# Patient Record
Sex: Female | Born: 1975 | Race: White | Hispanic: No | Marital: Married | Smoking: Former smoker
Health system: Southern US, Community
[De-identification: ages and names within clinical notes are randomized; demographics above are authoritative.]

## PROBLEM LIST (undated history)

## (undated) DIAGNOSIS — S61452A Open bite of left hand, initial encounter: Secondary | ICD-10-CM

## (undated) DIAGNOSIS — L089 Local infection of the skin and subcutaneous tissue, unspecified: Secondary | ICD-10-CM

## (undated) HISTORY — DX: Local infection of the skin and subcutaneous tissue, unspecified: L08.9

## (undated) HISTORY — DX: Open bite of left hand, initial encounter: S61.452A

## (undated) HISTORY — PX: OTHER SURGICAL HISTORY: SHX169

---

## 1998-04-23 ENCOUNTER — Emergency Department (HOSPITAL_COMMUNITY): Admission: EM | Admit: 1998-04-23 | Discharge: 1998-04-23 | Payer: Self-pay | Admitting: Emergency Medicine

## 1998-09-14 ENCOUNTER — Inpatient Hospital Stay (HOSPITAL_COMMUNITY): Admission: AD | Admit: 1998-09-14 | Discharge: 1998-09-14 | Payer: Self-pay | Admitting: Obstetrics

## 1998-10-24 ENCOUNTER — Encounter: Payer: Self-pay | Admitting: Emergency Medicine

## 1998-10-24 ENCOUNTER — Emergency Department (HOSPITAL_COMMUNITY): Admission: EM | Admit: 1998-10-24 | Discharge: 1998-10-24 | Payer: Self-pay | Admitting: Emergency Medicine

## 1999-03-11 ENCOUNTER — Encounter: Payer: Self-pay | Admitting: Family Medicine

## 1999-03-11 ENCOUNTER — Ambulatory Visit (HOSPITAL_COMMUNITY): Admission: RE | Admit: 1999-03-11 | Discharge: 1999-03-11 | Payer: Self-pay | Admitting: Family Medicine

## 1999-04-06 ENCOUNTER — Inpatient Hospital Stay (HOSPITAL_COMMUNITY): Admission: EM | Admit: 1999-04-06 | Discharge: 1999-04-08 | Payer: Self-pay | Admitting: Emergency Medicine

## 2000-04-19 ENCOUNTER — Other Ambulatory Visit: Admission: RE | Admit: 2000-04-19 | Discharge: 2000-04-19 | Payer: Self-pay | Admitting: Obstetrics and Gynecology

## 2001-07-05 ENCOUNTER — Other Ambulatory Visit: Admission: RE | Admit: 2001-07-05 | Discharge: 2001-07-05 | Payer: Self-pay | Admitting: Obstetrics and Gynecology

## 2001-09-19 ENCOUNTER — Encounter: Payer: Self-pay | Admitting: Emergency Medicine

## 2001-09-19 ENCOUNTER — Emergency Department (HOSPITAL_COMMUNITY): Admission: EM | Admit: 2001-09-19 | Discharge: 2001-09-19 | Payer: Self-pay | Admitting: Emergency Medicine

## 2002-08-22 ENCOUNTER — Other Ambulatory Visit: Admission: RE | Admit: 2002-08-22 | Discharge: 2002-08-22 | Payer: Self-pay | Admitting: Obstetrics and Gynecology

## 2003-01-10 ENCOUNTER — Encounter: Payer: Self-pay | Admitting: Internal Medicine

## 2003-01-10 ENCOUNTER — Encounter: Admission: RE | Admit: 2003-01-10 | Discharge: 2003-01-10 | Payer: Self-pay | Admitting: Internal Medicine

## 2003-10-09 ENCOUNTER — Other Ambulatory Visit: Admission: RE | Admit: 2003-10-09 | Discharge: 2003-10-09 | Payer: Self-pay | Admitting: Obstetrics and Gynecology

## 2004-11-27 ENCOUNTER — Other Ambulatory Visit: Admission: RE | Admit: 2004-11-27 | Discharge: 2004-11-27 | Payer: Self-pay | Admitting: Obstetrics and Gynecology

## 2005-01-01 ENCOUNTER — Encounter: Admission: RE | Admit: 2005-01-01 | Discharge: 2005-01-01 | Payer: Self-pay | Admitting: Internal Medicine

## 2005-12-01 ENCOUNTER — Other Ambulatory Visit: Admission: RE | Admit: 2005-12-01 | Discharge: 2005-12-01 | Payer: Self-pay | Admitting: Obstetrics and Gynecology

## 2006-04-19 ENCOUNTER — Emergency Department (HOSPITAL_COMMUNITY): Admission: EM | Admit: 2006-04-19 | Discharge: 2006-04-19 | Payer: Self-pay | Admitting: Emergency Medicine

## 2008-11-29 ENCOUNTER — Ambulatory Visit: Payer: Self-pay | Admitting: Internal Medicine

## 2009-01-28 ENCOUNTER — Ambulatory Visit: Payer: Self-pay | Admitting: Internal Medicine

## 2009-02-18 ENCOUNTER — Ambulatory Visit: Payer: Self-pay | Admitting: Internal Medicine

## 2009-05-30 ENCOUNTER — Ambulatory Visit: Payer: Self-pay | Admitting: Sports Medicine

## 2009-05-30 DIAGNOSIS — M25569 Pain in unspecified knee: Secondary | ICD-10-CM | POA: Insufficient documentation

## 2009-05-30 DIAGNOSIS — M25559 Pain in unspecified hip: Secondary | ICD-10-CM | POA: Insufficient documentation

## 2009-05-30 DIAGNOSIS — M79609 Pain in unspecified limb: Secondary | ICD-10-CM | POA: Insufficient documentation

## 2010-11-11 ENCOUNTER — Ambulatory Visit: Payer: Self-pay | Admitting: Internal Medicine

## 2011-12-24 ENCOUNTER — Ambulatory Visit (INDEPENDENT_AMBULATORY_CARE_PROVIDER_SITE_OTHER): Payer: 59 | Admitting: Family Medicine

## 2011-12-24 ENCOUNTER — Encounter: Payer: Self-pay | Admitting: Family Medicine

## 2011-12-24 VITALS — BP 134/76 | HR 57 | Temp 97.8°F | Ht 64.0 in | Wt 146.0 lb

## 2011-12-24 DIAGNOSIS — M25579 Pain in unspecified ankle and joints of unspecified foot: Secondary | ICD-10-CM

## 2011-12-24 DIAGNOSIS — M25572 Pain in left ankle and joints of left foot: Secondary | ICD-10-CM

## 2011-12-24 NOTE — Patient Instructions (Signed)
Your musculoskeletal ultrasound was negative for a stress fracture. This is most consistent with posterior tibialis tendinopathy. Ice the area for 15 minutes at a time 3-4 times a day as needed (definitely do so after your runs). Strengthening exercises - theraband internal rotation and calf raises - 3 sets of 10 once a day. Buy inserts with good arch supports and try these with shorter runs at first - this will decrease how much you stretch this tendon out with pronation to toe push-off. Generally cut running down to about 50-70% of what you would normally do when dealing with pain. If necessary you may have to rest completely before race - it's better to go in undertrained and healthy than it is to push through training and limp through the race. As I stated this could be a stress reaction but this is less likely based on distribution of your pain, your description, exam, and normal ultrasound. Follow up with me as needed.

## 2011-12-25 ENCOUNTER — Encounter: Payer: Self-pay | Admitting: Family Medicine

## 2011-12-25 DIAGNOSIS — M25572 Pain in left ankle and joints of left foot: Secondary | ICD-10-CM | POA: Insufficient documentation

## 2011-12-25 NOTE — Assessment & Plan Note (Signed)
Left ankle post tib tendinopathy - less likely stress reaction (as she has normal msk ultrasound - would expect findings of stress fracture if this was present given she is over 3 weeks out from initiation of pain).  Pain also in distribution of post tib (to plantar foot, up lower leg), reproduced with these motions.  X-rays unlikely to be helpful.  We discussed possibility of going ahead with MRI as this is more sensitive/specific - if she continues to worsen instead of improve despite relative rest, icing, home exercises, would move forward with this.  Start home rehab exercises.  Advised to get additional arch support - she's previously had the sports insoles here and they made her shins hurt worse so declined these.  NSAIDs, icing.  See instructions for further.

## 2011-12-25 NOTE — Progress Notes (Signed)
Subjective:    Patient ID: Meghan Elliott, female    DOB: 1976-10-05, 36 y.o.   MRN: [id number]  PCP: [facility]  HPI 36 yo F here for left ankle pain.  Patient reports on new years eve she inverted her left ankle and developed worsening pain medial aspect of ankle. She believes she had some pain prior to this. Running over 40 miles a week - training for a marathon in a couple weeks and ultimately going to do a half ironman triathlon. No bruising - maybe some swelling over area. H/o prior sprains to this ankle. Able to run 21 miles a week and a half ago, 18 miles at a time this weekend - had pain after. Pain started yesterday about 2 miles into run medially shooting into bottom of foot and up medial left lower leg. Has been icing.  History reviewed. No pertinent past medical history.  No current outpatient prescriptions on file prior to visit.    History reviewed. No pertinent past surgical history.  Allergies  Allergen Reactions  . Penicillins     History   Social History  . Marital Status: Married    Spouse Name: N/A    Number of Children: N/A  . Years of Education: N/A   Occupational History  . Not on file.   Social History Main Topics  . Smoking status: Former Games developer  . Smokeless tobacco: Not on file  . Alcohol Use: Not on file  . Drug Use: Not on file  . Sexually Active: Not on file   Other Topics Concern  . Not on file   Social History Narrative  . No narrative on file    Family History  Problem Relation Age of Onset  . Diabetes Mother   . Hypertension Mother   . Hypertension Father   . Heart attack Neg Hx   . Hyperlipidemia Neg Hx   . Sudden death Neg Hx     BP 134/76  Pulse 57  Temp(Src) 97.8 F (36.6 C) (Oral)  Ht 5\' 4"  (1.626 m)  Wt 146 lb (66.225 kg)  BMI 25.06 kg/m2  Review of Systems See HPI above.    Objective:   Physical Exam Gen: NAD  L ankle: No gross deformity, swelling, ecchymoses FROM ankle with pain on resisted IR and  plantar flexion. TTP greatest within post tib tendon medially lower leg through ankle.  Less TTP medial malleolus. Negative ant drawer and talar tilt.   Thompsons test negative. NV intact distally. Mod overpronation. States she is unable to do hop test 2/2 pain.  MSK u/s: No evidence medial malleolar fracture or stress fracture - contour same as right medial malleolus without bony irregularities, edema overlying cortex, or neovascularity.  Post tib tendon appears intact without neovascularity, partial tears though mildly thickened.    Assessment & Plan:  1. Left ankle post tib tendinopathy - less likely stress reaction (as she has normal msk ultrasound - would expect findings of stress fracture if this was present given she is over 3 weeks out from initiation of pain).  Pain also in distribution of post tib (to plantar foot, up lower leg), reproduced with these motions.  X-rays unlikely to be helpful.  We discussed possibility of going ahead with MRI as this is more sensitive/specific - if she continues to worsen instead of improve despite relative rest, icing, home exercises, would move forward with this.  Start home rehab exercises.  Advised to get additional arch support - she's previously had the sports  insoles here and they made her shins hurt worse so declined these.  NSAIDs, icing.  See instructions for further.

## 2012-03-11 ENCOUNTER — Ambulatory Visit (HOSPITAL_BASED_OUTPATIENT_CLINIC_OR_DEPARTMENT_OTHER)
Admission: RE | Admit: 2012-03-11 | Discharge: 2012-03-11 | Disposition: A | Discharge status: Home / Self Care | Payer: 59 | Source: Ambulatory Visit | Attending: Family Medicine | Admitting: Family Medicine

## 2012-03-11 ENCOUNTER — Ambulatory Visit (INDEPENDENT_AMBULATORY_CARE_PROVIDER_SITE_OTHER): Payer: 59 | Admitting: Family Medicine

## 2012-03-11 ENCOUNTER — Encounter: Payer: Self-pay | Admitting: Family Medicine

## 2012-03-11 VITALS — BP 125/80 | HR 74 | Temp 98.1°F | Ht 64.0 in | Wt 148.0 lb

## 2012-03-11 DIAGNOSIS — M25579 Pain in unspecified ankle and joints of unspecified foot: Secondary | ICD-10-CM | POA: Insufficient documentation

## 2012-03-11 DIAGNOSIS — M25572 Pain in left ankle and joints of left foot: Secondary | ICD-10-CM

## 2012-03-11 DIAGNOSIS — M7730 Calcaneal spur, unspecified foot: Secondary | ICD-10-CM | POA: Insufficient documentation

## 2012-03-11 NOTE — Progress Notes (Addendum)
Subjective:    Patient ID: Meghan Elliott, female    DOB: Mar 28, 1976, 36 y.o.   MRN: [id number]  PCP: [facility]  HPI  36 yo F here for f/u left ankle pain.  1/24: Patient reports on new years eve she inverted her left ankle and developed worsening pain medial aspect of ankle. She believes she had some pain prior to this. Running over 40 miles a week - training for a marathon in a couple weeks and ultimately going to do a half ironman triathlon. No bruising - maybe some swelling over area. H/o prior sprains to this ankle. Able to run 21 miles a week and a half ago, 18 miles at a time this weekend - had pain after. Pain started yesterday about 2 miles into run medially shooting into bottom of foot and up medial left lower leg. Has been icing.  4/12: Despite home exercises, arch supports, icing, relative rest then increasing activities patient has continued to struggle with medial left ankle pain. Tried to run this morning - only made it 16 minutes and had to stop. No bruising, minimal swelling. Tried some changes to her gait as well that have not helped.  History reviewed. No pertinent past medical history.  Current Outpatient Prescriptions on File Prior to Visit  Medication Sig Dispense Refill  . GIANVI 3-0.02 MG tablet         History reviewed. No pertinent past surgical history.  Allergies  Allergen Reactions  . Penicillins     History   Social History  . Marital Status: Married    Spouse Name: N/A    Number of Children: N/A  . Years of Education: N/A   Occupational History  . Not on file.   Social History Main Topics  . Smoking status: Former Games developer  . Smokeless tobacco: Not on file  . Alcohol Use: Not on file  . Drug Use: Not on file  . Sexually Active: Not on file   Other Topics Concern  . Not on file   Social History Narrative  . No narrative on file    Family History  Problem Relation Age of Onset  . Diabetes Mother   . Hypertension Mother   .  Hypertension Father   . Heart attack Neg Hx   . Hyperlipidemia Neg Hx   . Sudden death Neg Hx     BP 125/80  Pulse 74  Temp(Src) 98.1 F (36.7 C) (Oral)  Ht 5\' 4"  (1.626 m)  Wt 148 lb (67.132 kg)  BMI 25.40 kg/m2  LMP 02/22/2012  Review of Systems  See HPI above.    Objective:   Physical Exam  Gen: NAD  L ankle: No gross deformity, swelling, ecchymoses FROM ankle without pain on resisted IR and plantar flexion - had pain here last visit. TTP greatest within post tib tendon medially lower leg through ankle and medial malleolus.  No lateral malleolus, fibular head, base 5th, navicular, or other TTP about ankle. Negative ant drawer and talar tilt.   Thompsons test negative. NV intact distally. Mod overpronation. Able to hop 5 times but pain medial ankle.     Assessment & Plan:  1. Left ankle pain - persistent, not improved with typical treatment for post tib tendinopathy and she has been compliant with this.  Radiographs today without bony abnormalities medially.  Most likely causes include recalcitrant post tib tendinopathy, partial post tib tendon tear, medial malleolar stress fracture.  Will move forward with MRI to further assess.  Continue arch  support, nsaids, icing, home exercises in meantime.  Addendum 4/16:  Discussed MRI results with patient - no evidence of stress fracture, tendon tears, other abnormalities.  Believe this is post tib tendinopathy.  Discussed options and she would like to try nitro patches so sent these in to her pharmacy to trial at least 6 weeks.  Discussed risks, benefits.  F/u in 6 weeks.

## 2012-03-11 NOTE — Assessment & Plan Note (Signed)
persistent, not improved with typical treatment for post tib tendinopathy and she has been compliant with this.  Radiographs today without bony abnormalities medially.  Most likely causes include recalcitrant post tib tendinopathy, partial post tib tendon tear, medial malleolar stress fracture.  Will move forward with MRI to further assess.  Continue arch support, nsaids, icing, home exercises in meantime.

## 2012-03-12 ENCOUNTER — Ambulatory Visit (HOSPITAL_BASED_OUTPATIENT_CLINIC_OR_DEPARTMENT_OTHER)
Admission: RE | Admit: 2012-03-12 | Discharge: 2012-03-12 | Disposition: A | Discharge status: Home / Self Care | Payer: 59 | Source: Ambulatory Visit | Attending: Family Medicine | Admitting: Family Medicine

## 2012-03-12 DIAGNOSIS — M25572 Pain in left ankle and joints of left foot: Secondary | ICD-10-CM

## 2012-03-12 DIAGNOSIS — M25579 Pain in unspecified ankle and joints of unspecified foot: Secondary | ICD-10-CM

## 2012-03-12 DIAGNOSIS — M7730 Calcaneal spur, unspecified foot: Secondary | ICD-10-CM | POA: Insufficient documentation

## 2012-03-15 MED ORDER — NITROGLYCERIN 0.2 MG/HR TD PT24: MEDICATED_PATCH | TRANSDERMAL | Status: DC

## 2012-03-15 NOTE — Progress Notes (Signed)
Addended by: Lenda Kelp on: 03/15/2012 09:40 AM   Modules accepted: Orders

## 2012-04-12 ENCOUNTER — Other Ambulatory Visit: Payer: Self-pay | Admitting: Chiropractic Medicine

## 2012-04-12 DIAGNOSIS — M25512 Pain in left shoulder: Secondary | ICD-10-CM

## 2012-04-13 ENCOUNTER — Ambulatory Visit
Admission: RE | Admit: 2012-04-13 | Discharge: 2012-04-13 | Disposition: A | Discharge status: Home / Self Care | Payer: 59 | Source: Ambulatory Visit | Attending: Chiropractic Medicine | Admitting: Chiropractic Medicine

## 2012-04-13 DIAGNOSIS — M25512 Pain in left shoulder: Secondary | ICD-10-CM

## 2012-04-14 ENCOUNTER — Ambulatory Visit (INDEPENDENT_AMBULATORY_CARE_PROVIDER_SITE_OTHER): Payer: 59 | Admitting: Family Medicine

## 2012-04-14 VITALS — BP 127/82

## 2012-04-14 DIAGNOSIS — M25512 Pain in left shoulder: Secondary | ICD-10-CM

## 2012-04-14 DIAGNOSIS — M25519 Pain in unspecified shoulder: Secondary | ICD-10-CM

## 2012-04-14 NOTE — Patient Instructions (Signed)
You have rotator cuff tendinopathy. Try to avoid painful activities (overhead activities, lifting with extended arm, reaching behind) as much as possible. Aleve and/or tylenol as needed for pain. Subacromial injection may be beneficial to help with pain and to decrease inflammation - you were given this today. I would recommend waiting until Sunday to do your race rehearsal and to swim. Continue working with Riki Rusk. Do home exercise program with theraband and scapular stabilization exercises daily - these are very important for long term relief even if an injection was given. If not improving at follow-up we will consider further imaging and/or physical therapy.

## 2012-04-15 ENCOUNTER — Encounter: Payer: Self-pay | Admitting: Family Medicine

## 2012-04-15 ENCOUNTER — Other Ambulatory Visit: Payer: 59

## 2012-04-15 DIAGNOSIS — M25512 Pain in left shoulder: Secondary | ICD-10-CM | POA: Insufficient documentation

## 2012-04-15 NOTE — Progress Notes (Signed)
Subjective:    Patient ID: Meghan Elliott, female    DOB: [birth year], 36 y.o.   MRN: [id number]  PCP: [facility]  Shoulder Pain    36 yo F here for new left shoulder pain.  1/24: Patient reports on new years eve she inverted her left ankle and developed worsening pain medial aspect of ankle. She believes she had some pain prior to this. Running over 40 miles a week - training for a marathon in a couple weeks and ultimately going to do a half ironman triathlon. No bruising - maybe some swelling over area. H/o prior sprains to this ankle. Able to run 21 miles a week and a half ago, 18 miles at a time this weekend - had pain after. Pain started yesterday about 2 miles into run medially shooting into bottom of foot and up medial left lower leg. Has been icing.  4/12: Despite home exercises, arch supports, icing, relative rest then increasing activities patient has continued to struggle with medial left ankle pain. Tried to run this morning - only made it 16 minutes and had to stop. No bruising, minimal swelling. Tried some changes to her gait as well that have not helped.  5/16: Patient's ankle is significantly improved with nitro patches. Returns today with 1 year of left shoulder pain - has been seeing chiropractor Thereasa Distance for treatment over this time. Overall has been tolerable - no other treatment since then. Is doing a half-ironman in 2 weeks. Pain worse in left shoulder with overhead motions, swimming. Feels pain in posterior shoulder as well. No radiation into arm. No numbness/tingling. No bowel/bladder dysfunction. Had MRI on 5/15 which showed moderate rotator cuff tendinopathy with possible undersurface tear/fraying.  No full thickness rotator cuff tears, labral pathology, other acute findings. Is right handed.   History reviewed. No pertinent past medical history.  Current Outpatient Prescriptions on File Prior to Visit  Medication Sig Dispense Refill  . GIANVI  3-0.02 MG tablet       . nitroGLYCERIN (NITRODUR - DOSED IN MG/24 HR) 0.2 mg/hr 1/4 patch over affected area of left ankle - change daily  30 patch  1    History reviewed. No pertinent past surgical history.  Allergies  Allergen Reactions  . Penicillins     History   Social History  . Marital Status: Married    Spouse Name: N/A    Number of Children: N/A  . Years of Education: N/A   Occupational History  . Not on file.   Social History Main Topics  . Smoking status: Former Games developer  . Smokeless tobacco: Not on file  . Alcohol Use: Not on file  . Drug Use: Not on file  . Sexually Active: Not on file   Other Topics Concern  . Not on file   Social History Narrative  . No narrative on file    Family History  Problem Relation Age of Onset  . Diabetes Mother   . Hypertension Mother   . Hypertension Father   . Heart attack Neg Hx   . Hyperlipidemia Neg Hx   . Sudden death Neg Hx     BP 127/82  Review of Systems  See HPI above.    Objective:   Physical Exam  Gen: NAD  L shoulder: No swelling, ecchymoses.  No gross deformity. TTP within left trapezius including overlying left scapula.  No biceps tendon, AC TTP. FROM with mild painful arc. Positive Hawkins, Neers. Negative Speeds, Yergasons. Strength 5/5 with empty can  and resisted internal/external rotation. Negative apprehension. NV intact distally.  R shoulder: FROM without pain or weakness.    Assessment & Plan:  1. Left shoulder pain - 2/2 rotator cuff tendinopathy with secondary scapular dysfunction/trapezius spasms.  Start home exercise program.  Icing, tylenol/aleve as needed.  Subacromial injection given today - rest for next few days from swimming, overhead activities, heavy lifting.  F/u prn.  Consider formal PT if not improving as expected.  After informed written consent, patient was seated on exam table. Left shoulder was prepped with alcohol swab and utilizing posterior approach, patient's  left subacromial space was injected with 3:1 marcaine: depomedrol. Patient tolerated the procedure well without immediate complications.

## 2012-04-15 NOTE — Assessment & Plan Note (Signed)
2/2 rotator cuff tendinopathy with secondary scapular dysfunction/trapezius spasms.  Start home exercise program.  Icing, tylenol/aleve as needed.  Subacromial injection given today - rest for next few days from swimming, overhead activities, heavy lifting.  F/u prn.  Consider formal PT if not improving as expected.  After informed written consent, patient was seated on exam table. Left shoulder was prepped with alcohol swab and utilizing posterior approach, patient's left subacromial space was injected with 3:1 marcaine: depomedrol. Patient tolerated the procedure well without immediate complications.

## 2013-11-04 ENCOUNTER — Encounter (HOSPITAL_COMMUNITY): Payer: Self-pay | Admitting: Emergency Medicine

## 2013-11-04 ENCOUNTER — Emergency Department (HOSPITAL_COMMUNITY)
Admission: EM | Admit: 2013-11-04 | Discharge: 2013-11-04 | Disposition: A | Discharge status: Home / Self Care | Payer: 59 | Attending: Emergency Medicine | Admitting: Emergency Medicine

## 2013-11-04 DIAGNOSIS — Z87891 Personal history of nicotine dependence: Secondary | ICD-10-CM | POA: Insufficient documentation

## 2013-11-04 DIAGNOSIS — S61409A Unspecified open wound of unspecified hand, initial encounter: Secondary | ICD-10-CM | POA: Insufficient documentation

## 2013-11-04 DIAGNOSIS — Z880 Allergy status to penicillin: Secondary | ICD-10-CM | POA: Insufficient documentation

## 2013-11-04 DIAGNOSIS — Y9389 Activity, other specified: Secondary | ICD-10-CM | POA: Insufficient documentation

## 2013-11-04 DIAGNOSIS — Z23 Encounter for immunization: Secondary | ICD-10-CM | POA: Insufficient documentation

## 2013-11-04 DIAGNOSIS — IMO0001 Reserved for inherently not codable concepts without codable children: Secondary | ICD-10-CM | POA: Insufficient documentation

## 2013-11-04 DIAGNOSIS — Y929 Unspecified place or not applicable: Secondary | ICD-10-CM | POA: Insufficient documentation

## 2013-11-04 DIAGNOSIS — S61452A Open bite of left hand, initial encounter: Secondary | ICD-10-CM

## 2013-11-04 MED ORDER — OXYCODONE-ACETAMINOPHEN 5-325 MG PO TABS: 1.0000 | ORAL_TABLET | ORAL | Status: DC | PRN

## 2013-11-04 MED ORDER — CLINDAMYCIN HCL 150 MG PO CAPS
300.0000 mg | ORAL_CAPSULE | Freq: Once | ORAL | Status: AC
  Administered 2013-11-04: 300 mg via ORAL
  Filled 2013-11-04: qty 2

## 2013-11-04 MED ORDER — BACITRACIN ZINC 500 UNIT/GM EX OINT
TOPICAL_OINTMENT | CUTANEOUS | Status: AC
  Administered 2013-11-04: 1 via TOPICAL
  Filled 2013-11-04: qty 0.9

## 2013-11-04 MED ORDER — TETANUS-DIPHTH-ACELL PERTUSSIS 5-2.5-18.5 LF-MCG/0.5 IM SUSP
0.5000 mL | Freq: Once | INTRAMUSCULAR | Status: AC
  Administered 2013-11-04: 0.5 mL via INTRAMUSCULAR
  Filled 2013-11-04: qty 0.5

## 2013-11-04 MED ORDER — CIPROFLOXACIN HCL 500 MG PO TABS: 500.0000 mg | ORAL_TABLET | Freq: Two times a day (BID) | ORAL | Status: DC

## 2013-11-04 MED ORDER — CIPROFLOXACIN HCL 250 MG PO TABS
500.0000 mg | ORAL_TABLET | Freq: Once | ORAL | Status: AC
  Administered 2013-11-04: 500 mg via ORAL
  Filled 2013-11-04: qty 2

## 2013-11-04 MED ORDER — CLINDAMYCIN HCL 300 MG PO CAPS: 300.0000 mg | ORAL_CAPSULE | Freq: Four times a day (QID) | ORAL | Status: DC

## 2013-11-04 MED ORDER — OXYCODONE-ACETAMINOPHEN 5-325 MG PO TABS
1.0000 | ORAL_TABLET | Freq: Once | ORAL | Status: AC
  Administered 2013-11-04: 1 via ORAL
  Filled 2013-11-04: qty 1

## 2013-11-04 NOTE — ED Provider Notes (Signed)
CSN: [id number]     Arrival date & time 11/04/13  0353 History   First MD Initiated Contact with Patient 11/04/13 0425     Chief Complaint  Patient presents with  . Animal Bite   (Consider location/radiation/quality/duration/timing/severity/associated sxs/prior Treatment) Patient is a 37 y.o. female presenting with animal bite. The history is provided by the patient.  Animal Bite She was bathing her cat when it bit her on the left hand. States that the teeth went in deeply. It initially was not painful but has gotten progressively more painful tonight is going on. This happened at about 6 PM. Currently, pain is rated 5/10 but if she moves her hand or touches the area, pain is 8/10. She denies fever or chills. She's not noticed any swelling or red streaks. The cat is up-to-date on all of its immunizations but the patient states her last tetanus immunization was about 13 years ago.   History reviewed. No pertinent past medical history. History reviewed. No pertinent past surgical history. Family History  Problem Relation Age of Onset  . Diabetes Mother   . Hypertension Mother   . Hypertension Father   . Heart attack Neg Hx   . Hyperlipidemia Neg Hx   . Sudden death Neg Hx    History  Substance Use Topics  . Smoking status: Former Games developer  . Smokeless tobacco: Not on file  . Alcohol Use: Not on file   OB History   Grav Para Term Preterm Abortions TAB SAB Ect Mult Living                 Review of Systems  All other systems reviewed and are negative.    Allergies  Penicillins  Home Medications   Current Outpatient Rx  Name  Route  Sig  Dispense  Refill  . GIANVI 3-0.02 MG tablet               . nitroGLYCERIN (NITRODUR - DOSED IN MG/24 HR) 0.2 mg/hr      1/4 patch over affected area of left ankle - change daily   30 patch   1    BP 118/65  Pulse 70  Temp(Src) 98.4 F (36.9 C) (Oral)  Resp 16  Ht 5\' 4"  (1.626 m)  Wt 183 lb (83.008 kg)  BMI 31.40 kg/m2  SpO2  98% Physical Exam  Nursing note and vitals reviewed.  37 year old female, resting comfortably and in no acute distress. Vital signs are normal. Oxygen saturation is 98%, which is normal. Head is normocephalic and atraumatic. PERRLA, EOMI. Oropharynx is clear. Neck is nontender and supple without adenopathy or JVD. Back is nontender and there is no CVA tenderness. Lungs are clear without rales, wheezes, or rhonchi. Chest is nontender. Heart has regular rate and rhythm without murmur. Abdomen is soft, flat, nontender without masses or hepatosplenomegaly and peristalsis is normoactive. Extremities have no cyanosis or edema, full range of motion is present. Two puncture wounds are seen in the left arm. One is at the base of the thenar eminence, and the other is on the radial aspect of the wrist. The entire wrist area is tender but there is no significant swelling or drainage. There is pain on passive range of motion of the wrist.  Skin is warm and dry without rash. Neurologic: Mental status is normal, cranial nerves are intact, there are no motor or sensory deficits.  ED Course  Procedures (including critical care time)  MDM   1. Cat bite of  hand, left, initial encounter     Plates of the left hand and wrist. TDaP booster is given and she'll need to be started on antibiotics. Case is discussed with Gramig who is on call for hand surgery. She is penicillin allergic and he has recommended that she be placed on clindamycin and ciprofloxacin for antibiotic coverage. He states that he can see her in followup tomorrow [facility] at [facility] if she is having significant pain, otherwise he will see her in his office in 2 days.    Dione Booze, MD 11/04/13 [phone]

## 2013-11-04 NOTE — ED Notes (Signed)
Pt's own domestic cat bit her left hand while she was trying to give it a bath. Pain now worse. 8/10

## 2013-12-21 ENCOUNTER — Encounter: Payer: Self-pay | Admitting: Gastroenterology

## 2014-01-11 ENCOUNTER — Ambulatory Visit (INDEPENDENT_AMBULATORY_CARE_PROVIDER_SITE_OTHER): Payer: 59 | Admitting: Gastroenterology

## 2014-01-11 ENCOUNTER — Encounter: Payer: Self-pay | Admitting: Gastroenterology

## 2014-01-11 VITALS — BP 112/70 | HR 64 | Ht 64.25 in | Wt 189.5 lb

## 2014-01-11 DIAGNOSIS — R197 Diarrhea, unspecified: Secondary | ICD-10-CM

## 2014-01-11 MED ORDER — METRONIDAZOLE 500 MG PO TABS: 500.0000 mg | ORAL_TABLET | Freq: Two times a day (BID) | ORAL | Status: DC

## 2014-01-11 NOTE — Patient Instructions (Signed)
We have sent the following medications to your pharmacy for you to pick up at your convenience: Flagyl.  Call back if symptoms fail to improve.   Thank you for choosing me and [facility].  Venita LickMalcolm T. Pleas KochStark, Jr., MD., Clementeen GrahamFACG  cc: Princella PellegriniJustin Reed, PA

## 2014-01-11 NOTE — Progress Notes (Signed)
    History of Present Illness: This is a 38 year old female who took a ten-day course of clindamycin and Cipro following a cat bite on December 9. She subsequently developed crampy abdominal pain and diarrhea. She was evaluated at an urgent care clinic and prescribed 3 days of metronidazole. Stool testing was unremarkable except for fecal leukocytes. I cannot locate a C. difficile test report. Her symptoms have almost completely resolved. Occasionally she notes loose stools. No longer has abdominal pain. Denies weight loss, constipation, change in stool caliber, melena, hematochezia, nausea, vomiting, dysphagia, reflux symptoms, chest pain.  Review of Systems: Pertinent positive and negative review of systems were noted in the above HPI section. All other review of systems were otherwise negative.  Current Medications, Allergies, Past Medical History, Past Surgical History, Family History and Social History were reviewed in [facility] electronic medical record.  Physical Exam: General: Well developed , well nourished, no acute distress Head: Normocephalic and atraumatic Eyes:  sclerae anicteric, EOMI Ears: Normal auditory acuity Mouth: No deformity or lesions Neck: Supple, no masses or thyromegaly Lungs: Clear throughout to auscultation Heart: Regular rate and rhythm; no murmurs, rubs or bruits Abdomen: Soft, non tender and non distended. No masses, hepatosplenomegaly or hernias noted. Normal Bowel sounds Rectal: Deferred Musculoskeletal: Symmetrical with no gross deformities  Skin: No lesions on visible extremities Pulses:  Normal pulses noted Extremities: No clubbing, cyanosis, edema or deformities noted Neurological: Alert oriented x 4, grossly nonfocal Cervical Nodes:  No significant cervical adenopathy Inguinal Nodes: No significant inguinal adenopathy Psychological:  Alert and cooperative. Normal mood and affect  Assessment and Recommendations:  1. Acute diarrhea with fecal  leukocytes following a course of antibiotics. Suspected antibiotic related diarrhea or C. difficile. Begin a 7 day course of Flagyl for possibility of partially treated C. difficile. If her symptoms have not completely resolved she is advised to contact us for further evaluation.

## 2014-04-03 ENCOUNTER — Encounter: Payer: Self-pay | Admitting: Internal Medicine

## 2014-04-03 ENCOUNTER — Ambulatory Visit (INDEPENDENT_AMBULATORY_CARE_PROVIDER_SITE_OTHER): Payer: 59 | Admitting: Internal Medicine

## 2014-04-03 VITALS — BP 134/92 | HR 80 | Temp 99.2°F | Wt 181.0 lb

## 2014-04-03 DIAGNOSIS — Z8719 Personal history of other diseases of the digestive system: Secondary | ICD-10-CM

## 2014-04-03 DIAGNOSIS — J019 Acute sinusitis, unspecified: Secondary | ICD-10-CM

## 2014-04-03 DIAGNOSIS — J029 Acute pharyngitis, unspecified: Secondary | ICD-10-CM

## 2014-04-03 DIAGNOSIS — IMO0001 Reserved for inherently not codable concepts without codable children: Secondary | ICD-10-CM

## 2014-04-03 MED ORDER — HYDROCODONE-HOMATROPINE 5-1.5 MG/5ML PO SYRP: 5.0000 mL | ORAL_SOLUTION | Freq: Three times a day (TID) | ORAL | Status: DC | PRN

## 2014-04-03 MED ORDER — CLARITHROMYCIN 500 MG PO TABS: 500.0000 mg | ORAL_TABLET | Freq: Two times a day (BID) | ORAL | Status: DC

## 2014-04-03 NOTE — Patient Instructions (Signed)
Take Biaxin 500 mg twice daily for 10 days. Take Hycodan as needed for cough. Call if not better in 2 weeks.

## 2014-04-03 NOTE — Progress Notes (Signed)
   Subjective:    Patient ID: Meghan JonesValarie Elliott, female    DOB: 1976-08-07, 38 y.o.   MRN: [id number]  HPI Patient not seen here since 2011. 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 She had developed hypertension and diabetes when she was overweight. She began to run marathons and lost a great deal waiting was able to come off medication. This past Saturday she exercised a good deal. She woke up Sunday morning with sore throat coughing congestion. Other coworkers have been ill. No fever or shaking chills. Says she feels horrible. She quit smoking several years ago.    Review of Systems     Objective:   Physical Exam TMs are clear. Pharynx is injected without exudate. Neck is supple without significant adenopathy. She sounds nasally congested when she speaks. Chest clear.        Assessment & Plan:  Acute sinusitis  Acute pharyngitis  Plan: Hycodan 8 ounces 1 teaspoon by mouth every 8 hours when necessary cough  Biaxin 500 mg twice daily for 10 days  Of note, she had a cat bite in December, [facility] with Cipro and clindamycin and developed C. difficile diarrhea treated by Dr. Russella DarStark.

## 2014-04-05 ENCOUNTER — Telehealth: Payer: Self-pay

## 2014-04-05 NOTE — Telephone Encounter (Signed)
Patient informed. 

## 2014-04-05 NOTE — Telephone Encounter (Signed)
I explained to her that this is a flu like illness. See again if she wants to be seen  or wait and she how she is after antibiotics are done. She is on a good antibiotic. Would not prescribe Tamiflu for this.

## 2014-04-05 NOTE — Telephone Encounter (Signed)
Seen here on Tuesday for URI. Feels worse today with headache, body aches and low grade fever. Thinks she has the flu now. Continues with antibiotics and cough syrup.

## 2015-05-21 ENCOUNTER — Other Ambulatory Visit (HOSPITAL_COMMUNITY): Payer: Self-pay | Admitting: Obstetrics and Gynecology

## 2015-05-21 DIAGNOSIS — Z1231 Encounter for screening mammogram for malignant neoplasm of breast: Secondary | ICD-10-CM

## 2015-06-06 ENCOUNTER — Ambulatory Visit (HOSPITAL_COMMUNITY): Payer: Self-pay

## 2015-06-10 ENCOUNTER — Ambulatory Visit (HOSPITAL_COMMUNITY)
Admission: RE | Admit: 2015-06-10 | Discharge: 2015-06-10 | Disposition: A | Discharge status: Home / Self Care | Payer: PRIVATE HEALTH INSURANCE | Source: Ambulatory Visit | Attending: Obstetrics and Gynecology | Admitting: Obstetrics and Gynecology

## 2015-06-10 DIAGNOSIS — Z1231 Encounter for screening mammogram for malignant neoplasm of breast: Secondary | ICD-10-CM | POA: Insufficient documentation

## 2015-12-20 ENCOUNTER — Encounter: Payer: Self-pay | Admitting: Internal Medicine

## 2015-12-20 ENCOUNTER — Ambulatory Visit (INDEPENDENT_AMBULATORY_CARE_PROVIDER_SITE_OTHER): Payer: PRIVATE HEALTH INSURANCE | Admitting: Internal Medicine

## 2015-12-20 ENCOUNTER — Telehealth: Payer: Self-pay

## 2015-12-20 VITALS — BP 118/80 | HR 118 | Temp 101.8°F | Resp 20 | Ht 64.0 in | Wt 188.0 lb

## 2015-12-20 DIAGNOSIS — R509 Fever, unspecified: Secondary | ICD-10-CM | POA: Diagnosis not present

## 2015-12-20 DIAGNOSIS — J029 Acute pharyngitis, unspecified: Secondary | ICD-10-CM

## 2015-12-20 LAB — POCT RAPID STREP A (OFFICE): RAPID STREP A SCREEN: NEGATIVE

## 2015-12-20 MED ORDER — HYDROCODONE-ACETAMINOPHEN 10-325 MG PO TABS: 1.0000 | ORAL_TABLET | Freq: Four times a day (QID) | ORAL | Status: DC | PRN

## 2015-12-20 MED ORDER — CLARITHROMYCIN 500 MG PO TABS: 500.0000 mg | ORAL_TABLET | Freq: Two times a day (BID) | ORAL | Status: DC

## 2015-12-20 NOTE — Telephone Encounter (Signed)
Patient c/o sore throat, HA. APPT scheduled for today at 2:45

## 2015-12-20 NOTE — Patient Instructions (Signed)
Biaxin 500 mg twice daily for 10 days. Norco 5/325 one by mouth every 8 hours when necessary headache

## 2015-12-20 NOTE — Progress Notes (Signed)
   Subjective:    Patient ID: Meghan Elliott, female    DOB: [birth year], 39 y.o.   MRN: [id number]  HPI Last weekend developed sore throat. Got progressively worse over the past couple of days. Has had fatigue, fever, headache. Sore throat has gotten worse.    Review of Systems     Objective:   Physical Exam  Pharynx is red. Rapid prescription screen negative. TMs are slightly full bilaterally. Neck is supple without adenopathy. Chest clear to auscultation without rales or wheezing. Skin warm and dry. She looks fatigued.      Assessment & Plan:  Acute pharyngitis  Plan: Biaxin 500 mg twice daily for 10 days. Norco 5/325 #30 tablets one by mouth every 8 hours when necessary headache. Rest and drink plenty of fluids.

## 2016-03-23 ENCOUNTER — Other Ambulatory Visit: Payer: Self-pay | Admitting: Internal Medicine

## 2016-03-23 ENCOUNTER — Other Ambulatory Visit: Payer: PRIVATE HEALTH INSURANCE | Admitting: Internal Medicine

## 2016-03-23 DIAGNOSIS — Z0000 Encounter for general adult medical examination without abnormal findings: Secondary | ICD-10-CM

## 2016-03-23 DIAGNOSIS — Z13220 Encounter for screening for lipoid disorders: Secondary | ICD-10-CM

## 2016-03-23 DIAGNOSIS — Z1321 Encounter for screening for nutritional disorder: Secondary | ICD-10-CM

## 2016-03-23 DIAGNOSIS — Z1329 Encounter for screening for other suspected endocrine disorder: Secondary | ICD-10-CM

## 2016-03-23 DIAGNOSIS — Z130 Encounter for screening for diseases of the blood and blood-forming organs and certain disorders involving the immune mechanism: Secondary | ICD-10-CM

## 2016-03-23 LAB — CBC WITH DIFFERENTIAL/PLATELET
BASOS PCT: 0 %
Basophils Absolute: 0 cells/uL (ref 0–200)
EOS PCT: 1 %
Eosinophils Absolute: 74 cells/uL (ref 15–500)
HCT: 40.6 % (ref 35.0–45.0)
Hemoglobin: 13.2 g/dL (ref 11.7–15.5)
LYMPHS PCT: 36 %
Lymphs Abs: 2664 cells/uL (ref 850–3900)
MCH: 30 pg (ref 27.0–33.0)
MCHC: 32.5 g/dL (ref 32.0–36.0)
MCV: 92.3 fL (ref 80.0–100.0)
MONOS PCT: 3 %
MPV: 9.5 fL (ref 7.5–12.5)
Monocytes Absolute: 222 cells/uL (ref 200–950)
NEUTROS ABS: 4440 {cells}/uL (ref 1500–7800)
Neutrophils Relative %: 60 %
PLATELETS: 354 10*3/uL (ref 140–400)
RBC: 4.4 MIL/uL (ref 3.80–5.10)
RDW: 13.3 % (ref 11.0–15.0)
WBC: 7.4 10*3/uL (ref 3.8–10.8)

## 2016-03-23 LAB — COMPLETE METABOLIC PANEL WITHOUT GFR
ALT: 14 U/L (ref 6–29)
AST: 13 U/L (ref 10–30)
Albumin: 3.8 g/dL (ref 3.6–5.1)
Alkaline Phosphatase: 51 U/L (ref 33–115)
BUN: 17 mg/dL (ref 7–25)
CO2: 23 mmol/L (ref 20–31)
Calcium: 8.4 mg/dL — ABNORMAL LOW (ref 8.6–10.2)
Chloride: 104 mmol/L (ref 98–110)
Creat: 0.87 mg/dL (ref 0.50–1.10)
GFR, Est African American: >89 mL/min
GFR, Est Non African American: 83 mL/min
Glucose, Bld: 94 mg/dL (ref 65–99)
Potassium: 5 mmol/L (ref 3.5–5.3)
Sodium: 136 mmol/L (ref 135–146)
Total Bilirubin: 0.4 mg/dL (ref 0.2–1.2)
Total Protein: 6.7 g/dL (ref 6.1–8.1)

## 2016-03-23 LAB — TSH: TSH: 2.11 m[IU]/L

## 2016-03-23 LAB — LIPID PANEL
Cholesterol: 172 mg/dL (ref 125–200)
HDL: 40 mg/dL — ABNORMAL LOW
LDL Cholesterol: 115 mg/dL
Total CHOL/HDL Ratio: 4.3 ratio
Triglycerides: 83 mg/dL
VLDL: 17 mg/dL

## 2016-03-24 ENCOUNTER — Ambulatory Visit (INDEPENDENT_AMBULATORY_CARE_PROVIDER_SITE_OTHER): Payer: PRIVATE HEALTH INSURANCE | Admitting: Internal Medicine

## 2016-03-24 ENCOUNTER — Encounter: Payer: Self-pay | Admitting: Internal Medicine

## 2016-03-24 VITALS — BP 128/78 | HR 74 | Temp 97.6°F | Resp 18 | Ht 64.0 in | Wt 193.0 lb

## 2016-03-24 DIAGNOSIS — E669 Obesity, unspecified: Secondary | ICD-10-CM | POA: Diagnosis not present

## 2016-03-24 DIAGNOSIS — Z0000 Encounter for general adult medical examination without abnormal findings: Secondary | ICD-10-CM

## 2016-03-24 DIAGNOSIS — R7302 Impaired glucose tolerance (oral): Secondary | ICD-10-CM | POA: Diagnosis not present

## 2016-03-24 DIAGNOSIS — R748 Abnormal levels of other serum enzymes: Secondary | ICD-10-CM

## 2016-03-24 LAB — HEMOGLOBIN A1C
Hgb A1c MFr Bld: 5.8 % — ABNORMAL HIGH (ref <5.7)
MEAN PLASMA GLUCOSE: 120 mg/dL

## 2016-03-24 LAB — VITAMIN D 25 HYDROXY (VIT D DEFICIENCY, FRACTURES): VIT D 25 HYDROXY: 38 ng/mL (ref 30–100)

## 2016-03-24 NOTE — Progress Notes (Signed)
Subjective:    Patient ID: Meghan Elliott, female    DOB: February 16, 1976, 40 y.o.   MRN: [id number]  HPI 40 year old White Female for health maintenance and evaluation of medical issues.   Patient first presented to this office in 2003. At that time she weighed 205 pounds. By 2005 she weighed 223 pounds. By 2007 she weighed 226 pounds and had developed elevated blood pressure. She was on oral contraceptives and was smoking. Subsequently got an IUD and began exercise and weight loss regimen. By June 2007 she weighed 199 pounds. Hemoglobin A1c was 6.3% in May 2007. By October 2007 it had decreased to 5.8%. By June 2008 she weighed 163 pounds. By March 2010 she weighed 146 pounds. Her hemoglobin A1c was normal at 5.3%.  No history of accidents or operations.  Allergic to penicillin  One pregnancy. Had mammogram at [facility]  Tetanus immunization done December 2014 at [facility]  Social history: She works as Print production planner at [employer]. Completed high school. She is married. She exercises for an hour daily 6 days a week. She quit smoking in 2010. One daughter age 14 in the military in [place] expecting a baby in the near future. Lives in [place].  In 2015 she was seen by Dr. Russella Dar regarding diarrhea after course of Cipro and clindamycin for cat bite. Was suspected to have antibiotic associated diarrhea or perhaps partially treated C. difficile. Was given course of Flagyl and diarrhea resolved.  Family Hx- deceased age 65 had stroke, DM with complications.  Father died accidentally struck in and shower. Oldest sister with IDDM. Youngest sister with ovarian cancer doing okay.  Review of Systems  Constitutional: Negative.   Respiratory: Negative.   Cardiovascular: Negative.   Genitourinary: Negative.        Objective:   Physical Exam  Constitutional: She is oriented to person, place, and time. She appears well-developed and well-nourished. No distress.  HENT:  Head:  Normocephalic and atraumatic.  Right Ear: External ear normal.  Left Ear: External ear normal.  Mouth/Throat: Oropharynx is clear and moist. No oropharyngeal exudate.  Eyes: Conjunctivae and EOM are normal. Pupils are equal, round, and reactive to light. Right eye exhibits no discharge. Left eye exhibits no discharge. No scleral icterus.  Neck: Neck supple. No JVD present. No thyromegaly present.  Cardiovascular: Normal rate, regular rhythm, normal heart sounds and intact distal pulses.   No murmur heard. Pulmonary/Chest: Effort normal and breath sounds normal. She has no wheezes. She has no rales.  Abdominal: Soft. Bowel sounds are normal. She exhibits no distension and no mass. There is no tenderness. There is no rebound and no guarding.  Genitourinary:  Deferred to Dr. Dareen Piano GYN  Musculoskeletal: She exhibits no edema.  Lymphadenopathy:    She has no cervical adenopathy.  Neurological: She is alert and oriented to person, place, and time. She has normal reflexes. No cranial nerve deficit. Coordination normal.  Skin: Skin is warm and dry. No rash noted. She is not diaphoretic.  Psychiatric: She has a normal mood and affect. Her behavior is normal. Judgment and thought content normal.  Vitals reviewed.         Assessment & Plan:  Obesity-her weight has increased from 146 pounds in 2011 to 193 pounds. Does drink one or 2 beers on weekends. Works out 1 hour a day 6 days a week. Although her serum glucose is normal. Her hemoglobin A1c is 5.8%. She should watch carbs. She is counting her  calories. May need to alter her exercise regimen.  Low HDL cholesterol-otherwise lipid panel is normal  Plan: Return in one year or as needed. Continue to get annual mammogram.

## 2016-03-25 DIAGNOSIS — R748 Abnormal levels of other serum enzymes: Secondary | ICD-10-CM | POA: Insufficient documentation

## 2016-03-25 DIAGNOSIS — E669 Obesity, unspecified: Secondary | ICD-10-CM | POA: Insufficient documentation

## 2016-03-25 DIAGNOSIS — R7302 Impaired glucose tolerance (oral): Secondary | ICD-10-CM | POA: Insufficient documentation

## 2016-03-25 NOTE — Patient Instructions (Addendum)
Continue diet exercise and weight loss regimen. Return in one year or as needed. Watch carbohydrate consumption.

## 2016-10-06 ENCOUNTER — Ambulatory Visit (INDEPENDENT_AMBULATORY_CARE_PROVIDER_SITE_OTHER): Payer: PRIVATE HEALTH INSURANCE | Admitting: Internal Medicine

## 2016-10-06 ENCOUNTER — Encounter: Payer: Self-pay | Admitting: Internal Medicine

## 2016-10-06 VITALS — BP 122/80 | HR 96 | Temp 98.8°F | Wt 185.0 lb

## 2016-10-06 DIAGNOSIS — J029 Acute pharyngitis, unspecified: Secondary | ICD-10-CM

## 2016-10-06 DIAGNOSIS — J069 Acute upper respiratory infection, unspecified: Secondary | ICD-10-CM | POA: Diagnosis not present

## 2016-10-06 MED ORDER — HYDROCODONE-ACETAMINOPHEN 5-325 MG PO TABS: ORAL_TABLET | ORAL | 0 refills | Status: DC

## 2016-10-06 MED ORDER — CLARITHROMYCIN 500 MG PO TABS: 500.0000 mg | ORAL_TABLET | Freq: Two times a day (BID) | ORAL | 0 refills | Status: DC

## 2016-10-06 NOTE — Progress Notes (Signed)
   Subjective:    Patient ID: Meghan Elliott, female    DOB: 04/27/1976, 40 y.o.   MRN: [id number]  HPI Onset couple of days ago sore throat and postnasal drip with drainage. No fever or chills. Some slight cough. Not much sputum production. No ear pain.    Review of Systems see above     Objective:   Physical Exam Pharynx is slightly injected without exudate. TMs are clear. Neck is supple. No adenopathy. Chest clear to auscultation without rales or wheezing       Assessment & Plan:   Acute URI  Acute pharyngitis  Plan: Biaxin 500 mg bid x 10 days. Norco 5/325 #20 one po q 8 hours prn pain

## 2016-10-06 NOTE — Patient Instructions (Signed)
Biaxin 500 mg twice daily for 10 days. Norco 5/325 one by mouth every 12 hours when necessary sore throat pain. Take both medications with a meal.

## 2017-06-04 ENCOUNTER — Other Ambulatory Visit (HOSPITAL_COMMUNITY): Payer: Self-pay | Admitting: Obstetrics and Gynecology

## 2017-06-04 DIAGNOSIS — Z1231 Encounter for screening mammogram for malignant neoplasm of breast: Secondary | ICD-10-CM

## 2017-06-10 ENCOUNTER — Ambulatory Visit (HOSPITAL_COMMUNITY)
Admission: RE | Admit: 2017-06-10 | Discharge: 2017-06-10 | Disposition: A | Discharge status: Home / Self Care | Payer: PRIVATE HEALTH INSURANCE | Source: Ambulatory Visit | Attending: Obstetrics and Gynecology | Admitting: Obstetrics and Gynecology

## 2017-06-10 DIAGNOSIS — Z1231 Encounter for screening mammogram for malignant neoplasm of breast: Secondary | ICD-10-CM | POA: Diagnosis not present

## 2017-09-07 ENCOUNTER — Other Ambulatory Visit: Payer: PRIVATE HEALTH INSURANCE | Admitting: Internal Medicine

## 2017-09-07 DIAGNOSIS — E669 Obesity, unspecified: Secondary | ICD-10-CM

## 2017-09-07 DIAGNOSIS — R7302 Impaired glucose tolerance (oral): Secondary | ICD-10-CM

## 2017-09-07 DIAGNOSIS — Z0000 Encounter for general adult medical examination without abnormal findings: Secondary | ICD-10-CM

## 2017-09-07 DIAGNOSIS — Z1329 Encounter for screening for other suspected endocrine disorder: Secondary | ICD-10-CM

## 2017-09-07 DIAGNOSIS — Z1321 Encounter for screening for nutritional disorder: Secondary | ICD-10-CM

## 2017-09-08 LAB — COMPLETE METABOLIC PANEL WITH GFR
AG Ratio: 1.1 (calc) (ref 1.0–2.5)
ALKALINE PHOSPHATASE (APISO): 58 U/L (ref 33–115)
ALT: 11 U/L (ref 6–29)
AST: 12 U/L (ref 10–30)
Albumin: 4 g/dL (ref 3.6–5.1)
BUN: 18 mg/dL (ref 7–25)
CO2: 27 mmol/L (ref 20–32)
Calcium: 9 mg/dL (ref 8.6–10.2)
Chloride: 100 mmol/L (ref 98–110)
Creat: 0.88 mg/dL (ref 0.50–1.10)
GFR, EST AFRICAN AMERICAN: 93 mL/min/{1.73_m2} (ref >=60)
GFR, Est Non African American: 80 mL/min/{1.73_m2} (ref >=60)
GLOBULIN: 3.6 g/dL (ref 1.9–3.7)
Glucose, Bld: 98 mg/dL (ref 65–99)
Potassium: 4.8 mmol/L (ref 3.5–5.3)
SODIUM: 136 mmol/L (ref 135–146)
Total Bilirubin: 0.4 mg/dL (ref 0.2–1.2)
Total Protein: 7.6 g/dL (ref 6.1–8.1)

## 2017-09-08 LAB — CBC WITH DIFFERENTIAL/PLATELET
BASOS ABS: 30 {cells}/uL (ref 0–200)
Basophils Relative: 0.4 %
EOS PCT: 1.2 %
Eosinophils Absolute: 90 cells/uL (ref 15–500)
HEMATOCRIT: 40.3 % (ref 35.0–45.0)
Hemoglobin: 13.7 g/dL (ref 11.7–15.5)
LYMPHS ABS: 2903 {cells}/uL (ref 850–3900)
MCH: 30.6 pg (ref 27.0–33.0)
MCHC: 34 g/dL (ref 32.0–36.0)
MCV: 90 fL (ref 80.0–100.0)
MPV: 9.5 fL (ref 7.5–12.5)
Monocytes Relative: 3.6 %
NEUTROS PCT: 56.1 %
Neutro Abs: 4208 cells/uL (ref 1500–7800)
Platelets: 351 10*3/uL (ref 140–400)
RBC: 4.48 10*6/uL (ref 3.80–5.10)
RDW: 12.3 % (ref 11.0–15.0)
Total Lymphocyte: 38.7 %
WBC: 7.5 10*3/uL (ref 3.8–10.8)
WBCMIX: 270 {cells}/uL (ref 200–950)

## 2017-09-08 LAB — TSH: TSH: 2.76 mIU/L

## 2017-09-08 LAB — LIPID PANEL
CHOLESTEROL: 235 mg/dL — AB (ref <200)
HDL: 50 mg/dL — ABNORMAL LOW (ref >50)
LDL CHOLESTEROL (CALC): 162 mg/dL — AB
Non-HDL Cholesterol (Calc): 185 mg/dL (calc) — ABNORMAL HIGH (ref <130)
Total CHOL/HDL Ratio: 4.7 (calc) (ref <5.0)
Triglycerides: 114 mg/dL (ref <150)

## 2017-09-08 LAB — HEMOGLOBIN A1C
HEMOGLOBIN A1C: 5.5 %{Hb} (ref <5.7)
MEAN PLASMA GLUCOSE: 111 (calc)
eAG (mmol/L): 6.2 (calc)

## 2017-09-08 LAB — VITAMIN D 25 HYDROXY (VIT D DEFICIENCY, FRACTURES): Vit D, 25-Hydroxy: 42 ng/mL (ref 30–100)

## 2017-09-08 LAB — MICROALBUMIN / CREATININE URINE RATIO
CREATININE, URINE: 60 mg/dL (ref 20–275)
MICROALB UR: 0.6 mg/dL
MICROALB/CREAT RATIO: 10 ug/mg{creat} (ref <30)

## 2017-09-09 ENCOUNTER — Encounter: Payer: Self-pay | Admitting: Internal Medicine

## 2017-09-09 ENCOUNTER — Ambulatory Visit (INDEPENDENT_AMBULATORY_CARE_PROVIDER_SITE_OTHER): Payer: PRIVATE HEALTH INSURANCE | Admitting: Internal Medicine

## 2017-09-09 VITALS — BP 116/82 | HR 97 | Temp 98.9°F | Ht 64.25 in | Wt 201.0 lb

## 2017-09-09 DIAGNOSIS — Z0000 Encounter for general adult medical examination without abnormal findings: Secondary | ICD-10-CM

## 2017-09-09 DIAGNOSIS — E7800 Pure hypercholesterolemia, unspecified: Secondary | ICD-10-CM

## 2017-09-09 DIAGNOSIS — R7302 Impaired glucose tolerance (oral): Secondary | ICD-10-CM

## 2017-09-09 LAB — POCT URINALYSIS DIPSTICK
BILIRUBIN UA: NEGATIVE
Glucose, UA: NEGATIVE
KETONES UA: NEGATIVE
Leukocytes, UA: NEGATIVE
Nitrite, UA: NEGATIVE
PH UA: 7 (ref 5.0–8.0)
Protein, UA: NEGATIVE
RBC UA: NEGATIVE
Spec Grav, UA: 1.02 (ref 1.010–1.025)
Urobilinogen, UA: 0.2 E.U./dL

## 2017-09-09 NOTE — Progress Notes (Signed)
   Subjective:    Patient ID: Meghan Elliott, female    DOB: 17-Dec-1975, 41 y.o.   MRN: [id number]  HPI 41 year old Female for health maintenance exam and evaluation of medical issues. Hx impaired glucose tolerance and hyperlipidemia. Gained 8 pounds since last CPE. Has had Pap and mammogram through GYN- Dr. Dareen Piano. Decliones flu vaccine.  She first presented to this office in 2003.  At that time she weighed 205 pounds.  I 2007 she weighed 226 pounds and had developed elevated blood pressure.  She was on oral contraceptives and was smoking.  She subsequently got an IUD and began to exercise and lose weight.  By June 2007 she weighed 199 pounds.  Hemoglobin A1c was 6.3% in May 2007.  By March 2010 she weighed 146 pounds and her hemoglobin A1c was 5.3%.  She began to train for triathlons.  No history of accidents or operations.  Allergic to Penicillin.  1 pregnancy.  Had tetanus immunization done at [facility] December 2014  Social history: She works as Print production planner at [employer].  Completed high school.  She is married.  Quit smoking in 2010.  One daughter in the military in [place] with a baby.  Lives in [place].  In 2015 she was seen by Dr. Russella Dar regarding diarrhea after course of Cipro and clindamycin for Bite.  It was suspected to have antibiotic associated diarrhea or perhaps partially treated C. difficile.  Was given course of Flagyl and diarrhea resolved.  Family history: Mother deceased at age 79 with stroke and diabetes complications.  Father died accidentally with injury. Oldest sister with insulin-dependent diabetes.  Younger sister with history of ovarian cancer doing okay.        Review of Systems  Constitutional: Negative.   All other systems reviewed and are negative.      Objective:   Physical Exam  Constitutional: She is oriented to person, place, and time. She appears well-developed and well-nourished. No distress.  HENT:  Head:  Normocephalic and atraumatic.  Right Ear: External ear normal.  Left Ear: External ear normal.  Mouth/Throat: Oropharynx is clear and moist.  Eyes: Pupils are equal, round, and reactive to light. Conjunctivae and EOM are normal. Right eye exhibits no discharge. Left eye exhibits no discharge.  Neck: Neck supple. No JVD present. No thyromegaly present.  Cardiovascular: Normal rate, normal heart sounds and intact distal pulses.   No murmur heard. Pulmonary/Chest:  Breasts normal female  Abdominal: Soft. Bowel sounds are normal. She exhibits no distension and no mass. There is no tenderness. There is no rebound and no guarding.  Genitourinary:  Genitourinary Comments: Deferred to GYN  Musculoskeletal: She exhibits no edema.  Neurological: She is alert and oriented to person, place, and time. She has normal reflexes. No cranial nerve deficit. Coordination normal.  Skin: Skin is warm and dry. No rash noted. She is not diaphoretic.  Psychiatric: She has a normal mood and affect. Her behavior is normal. Judgment and thought content normal.  Vitals reviewed.         Assessment & Plan:  Hyperlipidemia-total cholesterol has increased from 172 a year ago to 235.  LDL cholesterol has increased from 114-162.  Patient realizes she needs to start diet and exercise regimen once again.  Follow-up in 1 year or as needed.  Impaired glucose tolerance  Last year hemoglobin A1c was 5.8% and this year is 5.5%.  Continue to monitor

## 2017-09-26 NOTE — Patient Instructions (Signed)
It was a pleasure to see you today.  Cholesterol is elevated.  Please work on diet and exercise regimen.  Hemoglobin A1c is normal.  Return in 1 year or as needed.

## 2018-05-30 ENCOUNTER — Other Ambulatory Visit (HOSPITAL_COMMUNITY): Payer: Self-pay | Admitting: Obstetrics and Gynecology

## 2018-05-30 DIAGNOSIS — Z1231 Encounter for screening mammogram for malignant neoplasm of breast: Secondary | ICD-10-CM

## 2018-06-15 ENCOUNTER — Ambulatory Visit (HOSPITAL_COMMUNITY)
Admission: RE | Admit: 2018-06-15 | Discharge: 2018-06-15 | Disposition: A | Discharge status: Home / Self Care | Payer: PRIVATE HEALTH INSURANCE | Source: Ambulatory Visit | Attending: Obstetrics and Gynecology | Admitting: Obstetrics and Gynecology

## 2018-06-15 DIAGNOSIS — Z1231 Encounter for screening mammogram for malignant neoplasm of breast: Secondary | ICD-10-CM | POA: Insufficient documentation

## 2018-09-15 ENCOUNTER — Other Ambulatory Visit: Payer: Self-pay | Admitting: Internal Medicine

## 2018-09-15 DIAGNOSIS — Z0000 Encounter for general adult medical examination without abnormal findings: Secondary | ICD-10-CM

## 2018-09-15 DIAGNOSIS — Z1321 Encounter for screening for nutritional disorder: Secondary | ICD-10-CM

## 2018-09-15 DIAGNOSIS — Z1329 Encounter for screening for other suspected endocrine disorder: Secondary | ICD-10-CM

## 2018-09-15 DIAGNOSIS — E7800 Pure hypercholesterolemia, unspecified: Secondary | ICD-10-CM

## 2018-09-15 DIAGNOSIS — R7302 Impaired glucose tolerance (oral): Secondary | ICD-10-CM

## 2018-09-20 ENCOUNTER — Other Ambulatory Visit: Payer: PRIVATE HEALTH INSURANCE | Admitting: Internal Medicine

## 2018-09-20 DIAGNOSIS — Z1329 Encounter for screening for other suspected endocrine disorder: Secondary | ICD-10-CM

## 2018-09-20 DIAGNOSIS — Z0000 Encounter for general adult medical examination without abnormal findings: Secondary | ICD-10-CM

## 2018-09-20 DIAGNOSIS — Z1321 Encounter for screening for nutritional disorder: Secondary | ICD-10-CM

## 2018-09-20 DIAGNOSIS — E7800 Pure hypercholesterolemia, unspecified: Secondary | ICD-10-CM

## 2018-09-20 DIAGNOSIS — R7302 Impaired glucose tolerance (oral): Secondary | ICD-10-CM

## 2018-09-20 NOTE — Addendum Note (Signed)
Addended by: Tawnya Crook on: 09/20/2018 12:18 PM   Modules accepted: Orders

## 2018-09-20 NOTE — Addendum Note (Signed)
Addended by: Tawnya Crook on: 09/20/2018 12:15 PM   Modules accepted: Orders

## 2018-09-21 LAB — CBC WITH DIFFERENTIAL/PLATELET
BASOS PCT: 0.6 %
Basophils Absolute: 50 cells/uL (ref 0–200)
EOS ABS: 83 {cells}/uL (ref 15–500)
Eosinophils Relative: 1 %
HCT: 37.4 % (ref 35.0–45.0)
HEMOGLOBIN: 12.7 g/dL (ref 11.7–15.5)
Lymphs Abs: 3378 cells/uL (ref 850–3900)
MCH: 30.5 pg (ref 27.0–33.0)
MCHC: 34 g/dL (ref 32.0–36.0)
MCV: 89.9 fL (ref 80.0–100.0)
MONOS PCT: 3.3 %
MPV: 10.3 fL (ref 7.5–12.5)
Neutro Abs: 4515 cells/uL (ref 1500–7800)
Neutrophils Relative %: 54.4 %
Platelets: 299 10*3/uL (ref 140–400)
RBC: 4.16 10*6/uL (ref 3.80–5.10)
RDW: 12.2 % (ref 11.0–15.0)
Total Lymphocyte: 40.7 %
WBC: 8.3 10*3/uL (ref 3.8–10.8)
WBCMIX: 274 {cells}/uL (ref 200–950)

## 2018-09-21 LAB — HEMOGLOBIN A1C
Hgb A1c MFr Bld: 5.7 % of total Hgb — ABNORMAL HIGH (ref <5.7)
Mean Plasma Glucose: 117 (calc)
eAG (mmol/L): 6.5 (calc)

## 2018-09-21 LAB — VITAMIN D 25 HYDROXY (VIT D DEFICIENCY, FRACTURES): Vit D, 25-Hydroxy: 49 ng/mL (ref 30–100)

## 2018-09-21 LAB — MICROALBUMIN / CREATININE URINE RATIO
Creatinine, Urine: 134 mg/dL (ref 20–275)
MICROALB/CREAT RATIO: 3 ug/mg{creat} (ref <30)
Microalb, Ur: 0.4 mg/dL

## 2018-09-21 LAB — TSH: TSH: 2.49 m[IU]/L

## 2018-09-23 ENCOUNTER — Encounter: Payer: PRIVATE HEALTH INSURANCE | Admitting: Internal Medicine

## 2018-10-06 ENCOUNTER — Encounter: Payer: Self-pay | Admitting: Internal Medicine

## 2018-10-06 ENCOUNTER — Ambulatory Visit: Payer: PRIVATE HEALTH INSURANCE | Admitting: Internal Medicine

## 2018-10-06 VITALS — BP 120/80 | HR 68 | Temp 98.1°F | Ht 64.25 in | Wt 202.0 lb

## 2018-10-06 DIAGNOSIS — H6502 Acute serous otitis media, left ear: Secondary | ICD-10-CM | POA: Diagnosis not present

## 2018-10-06 DIAGNOSIS — H60312 Diffuse otitis externa, left ear: Secondary | ICD-10-CM

## 2018-10-06 MED ORDER — NEOMYCIN-POLYMYXIN-HC 3.5-10000-1 OT SOLN: OTIC | 0 refills | Status: DC

## 2018-10-06 MED ORDER — AZITHROMYCIN 250 MG PO TABS: ORAL_TABLET | ORAL | 0 refills | Status: DC

## 2018-10-06 NOTE — Progress Notes (Signed)
   Subjective:    Patient ID: Meghan Elliott, female    DOB: [birth year], 42 y.o.   MRN: [id number]  HPI Acute onset left otalgia. Awakened this am with it.  No fever or chills.  No significant sore throat.  Has had some postnasal drip.  Has washed her hair in the bathtub recently.  Hurts to touch her left earlobe she says.    Review of Systems see above     Objective:   Physical Exam Left external canal is slightly red but no drainage.  Left TM is full but not red.  Pharynx is very slightly injected.  Neck is supple.  Chest clear to auscultation.  No adenopathy.       Assessment & Plan:  Left external otitis  Acute left serous otitis media  Plan: Zithromax Z-PAK take 2 tabs day 1 followed by 1 tab days 2 through 5.  Cortisporin otic suspension 4 drops in left external ear canal 4 times a day for 5 days.  Has appointment for physical exam next week.  Can recheck then.

## 2018-10-06 NOTE — Patient Instructions (Signed)
Cortisporin otic suspension 4 drops in left external ear canal 4 times a day for 5 days.  Follow-up next week at time of physical exam.  Zithromax Z-PAK take 2 tabs day 1 followed by 1 tab days 2 through 5.

## 2018-10-08 LAB — GLUCOSE, POCT (MANUAL RESULT ENTRY): POC Glucose: 90 mg/dl (ref 70–99)

## 2018-10-12 ENCOUNTER — Encounter: Payer: Self-pay | Admitting: Internal Medicine

## 2018-10-12 ENCOUNTER — Ambulatory Visit (INDEPENDENT_AMBULATORY_CARE_PROVIDER_SITE_OTHER): Payer: PRIVATE HEALTH INSURANCE | Admitting: Internal Medicine

## 2018-10-12 VITALS — BP 102/80 | HR 85 | Ht 64.0 in | Wt 203.0 lb

## 2018-10-12 DIAGNOSIS — H6692 Otitis media, unspecified, left ear: Secondary | ICD-10-CM | POA: Diagnosis not present

## 2018-10-12 DIAGNOSIS — Z0000 Encounter for general adult medical examination without abnormal findings: Secondary | ICD-10-CM

## 2018-10-12 DIAGNOSIS — R7302 Impaired glucose tolerance (oral): Secondary | ICD-10-CM | POA: Diagnosis not present

## 2018-10-12 DIAGNOSIS — E7800 Pure hypercholesterolemia, unspecified: Secondary | ICD-10-CM | POA: Diagnosis not present

## 2018-10-12 LAB — POCT URINALYSIS DIPSTICK
APPEARANCE: NEGATIVE
Bilirubin, UA: NEGATIVE
Glucose, UA: NEGATIVE
Ketones, UA: NEGATIVE
LEUKOCYTES UA: NEGATIVE
NITRITE UA: NEGATIVE
Odor: NEGATIVE
PROTEIN UA: NEGATIVE
RBC UA: NEGATIVE
Spec Grav, UA: 1.01 (ref 1.010–1.025)
Urobilinogen, UA: 0.2 E.U./dL
pH, UA: 6.5 (ref 5.0–8.0)

## 2018-10-12 NOTE — Progress Notes (Signed)
Subjective:    Patient ID: Meghan Elliott, female    DOB: 1976/07/11, 42 y.o.   MRN: [id number]  HPI   42 year old Female for health maintenance exam and evaluation of medical issues.  Hgb AIC has increased to 5.7%. Still exercises and runs short races. Knows what to eat.  BMI is 34. Had lost 15 pounds this past Summer she says. By our records, has gained 2 pounds.  Had GYN exam by Dr. Dareen Piano. Had mammogram at [facility].  She first presented to this office in 2003.  At that time she weighed 205 pounds.  By 2007 she weighed 226 pounds and had developed elevated blood pressure.  She was smoking and was on oral contraceptives.  She subsequently had an IUD placed and began to exercise and lose weight.  By June 2007 she weighed 199 pounds.  Hemoglobin A1c was 6.3% in May 2007.  March 2010 she weighed 146 pounds and her hemoglobin A1c was 5.3%.  She began to train for triathlons.  No history of accidents or operations.  Allergic to penicillin.  One pregnancy.  She had tetanus immunization at any point in hospital December 2014.  In 2015 she was seen by Dr. Russella Dar regarding diarrhea after course of Cipro and clindamycin for cat bite prescribed in the emergency department.  It was suspected that she had antibiotic associated diarrhea  or perhaps partially treated C. difficile.  She was given a course of Flagyl and  diarrhea resolved.  Social history: She works as Print production planner at [employer].  Completed high school.  She is married.  Quit smoking in 2010.  One daughter in the military with a baby.  Patient resides in [place].  Family history: Mother died at age 74 with stroke and diabetes complications.  Father died accidentally with injury.  Older sister with insulin-dependent diabetes.  Younger sister with history of ovarian cancer doing okay.      Review of Systems  Constitutional: Negative.   HENT: Negative.        Left ear pain  Respiratory: Negative.     Cardiovascular: Negative.   Gastrointestinal: Negative.   Genitourinary: Negative.   Musculoskeletal: Negative.   Neurological: Negative.   Psychiatric/Behavioral: Negative.        Objective:   Physical Exam  Constitutional: She is oriented to person, place, and time. She appears well-developed and well-nourished. No distress.  HENT:  Head: Normocephalic and atraumatic.  Right Ear: External ear normal.  Left Ear: External ear normal.  Nose: Nose normal.  Mouth/Throat: Oropharynx is clear and moist. No oropharyngeal exudate.  Left external ear canal is red  Eyes: Pupils are equal, round, and reactive to light. Conjunctivae and EOM are normal. Right eye exhibits no discharge. Left eye exhibits no discharge. No scleral icterus.  Neck: Neck supple. No JVD present. No tracheal deviation present. No thyromegaly present.  Cardiovascular: Normal rate, regular rhythm and normal heart sounds.  No murmur heard. Pulmonary/Chest: Effort normal and breath sounds normal. No stridor. No respiratory distress. She has no wheezes. She has no rales.  Breasts normal female  Abdominal: Soft. She exhibits no distension and no mass. There is no tenderness. There is no rebound and no guarding.  Genitourinary:  Genitourinary Comments: Deferred to GYN  Musculoskeletal: She exhibits no edema.  Lymphadenopathy:    She has no cervical adenopathy.  Neurological: She is alert and oriented to person, place, and time. She displays normal reflexes. No cranial nerve deficit. She  exhibits normal muscle tone. Coordination normal.  Skin: Skin is warm and dry. No rash noted. She is not diaphoretic. No erythema. No pallor.  Psychiatric: She has a normal mood and affect. Her behavior is normal. Judgment and thought content normal.  Vitals reviewed.         Assessment & Plan:  Left external otitis- use Cortisporin otic suspension 4 drops in left ear 4 times a day for 5 days.  Patient does not know how she developed  this problem.  Has not been swimming.  Elevated hemoglobin A1c-is to work on diet exercise and weight loss and follow-up in 6 months  Pure hypercholesterolemia --a year ago total cholesterol was 235 and had been 172 when checked 2 years ago.Marland Kitchen.  LDL was 162 when checked a year ago.  Needs to get more serious about diet exercise and weight loss.  She is done it before and I am sure she can do it again.  For some reason her lipid panel was not done with this visit.  After the holidays it needs to be checked.

## 2018-10-23 DIAGNOSIS — E7800 Pure hypercholesterolemia, unspecified: Secondary | ICD-10-CM | POA: Insufficient documentation

## 2018-10-23 NOTE — Patient Instructions (Signed)
Use Cortisporin otic suspension in left external ear canal 4 times a day for 5 days.  Watch diet and try to exercise a bit more.  Lipid panel needs to be checked after the holidays.  Watch carbohydrates due to glucose intolerance.

## 2019-01-24 ENCOUNTER — Encounter: Payer: Self-pay | Admitting: Internal Medicine

## 2019-01-24 ENCOUNTER — Ambulatory Visit (INDEPENDENT_AMBULATORY_CARE_PROVIDER_SITE_OTHER): Payer: PRIVATE HEALTH INSURANCE | Admitting: Internal Medicine

## 2019-01-24 ENCOUNTER — Other Ambulatory Visit: Payer: Self-pay | Admitting: Internal Medicine

## 2019-01-24 VITALS — BP 130/80 | HR 100 | Temp 98.9°F | Ht 64.0 in | Wt 201.0 lb

## 2019-01-24 DIAGNOSIS — J029 Acute pharyngitis, unspecified: Secondary | ICD-10-CM | POA: Diagnosis not present

## 2019-01-24 DIAGNOSIS — R059 Cough, unspecified: Secondary | ICD-10-CM

## 2019-01-24 DIAGNOSIS — H6503 Acute serous otitis media, bilateral: Secondary | ICD-10-CM | POA: Diagnosis not present

## 2019-01-24 DIAGNOSIS — R05 Cough: Secondary | ICD-10-CM | POA: Diagnosis not present

## 2019-01-24 LAB — POCT RAPID STREP A (OFFICE): Rapid Strep A Screen: NEGATIVE

## 2019-01-24 MED ORDER — HYDROCODONE-HOMATROPINE 5-1.5 MG/5ML PO SYRP: 5.0000 mL | ORAL_SOLUTION | Freq: Three times a day (TID) | ORAL | 0 refills | Status: DC | PRN

## 2019-01-24 MED ORDER — AZITHROMYCIN 250 MG PO TABS
ORAL_TABLET | ORAL | 0 refills | Status: DC
Start: 2019-01-24 — End: 2019-10-19

## 2019-01-24 NOTE — Telephone Encounter (Signed)
Done

## 2019-01-24 NOTE — Addendum Note (Signed)
Addended by: Gregery Na on: 01/24/2019 12:04 PM   Modules accepted: Orders

## 2019-01-24 NOTE — Telephone Encounter (Signed)
Patient called and said that Dr Lenord Fellers said she was going to write her a script for a Z pack and cough syrup and when she got to the pharmacy they said they only have the Z pack

## 2019-01-24 NOTE — Telephone Encounter (Signed)
Please call her. This has now been sent

## 2019-01-25 NOTE — Progress Notes (Signed)
   Subjective:    Patient ID: Meghan Elliott, female    DOB: 1976/07/29, 43 y.o.   MRN: [id number]  HPI 43 year old Female in today with sore throat symptoms.  No fever or shaking chills.    Review of Systems  Constitutional: Positive for fatigue.  HENT: Positive for congestion. Negative for ear pain.   Eyes: Negative for discharge.  Respiratory: Negative.   Gastrointestinal: Negative.   Neurological: Negative for dizziness.       Objective:   Physical Exam Vitals signs reviewed.  Constitutional:      Appearance: She is well-developed.  HENT:     Head: Normocephalic and atraumatic.     Ears:     Comments: Both TMs slightly full right fuller than the left.    Nose: No rhinorrhea.     Mouth/Throat:     Mouth: Mucous membranes are moist.     Pharynx: No oropharyngeal exudate or uvula swelling.     Tonsils: No tonsillar exudate.  Eyes:     Conjunctiva/sclera: Conjunctivae normal.  Neck:     Musculoskeletal: Neck supple.  Pulmonary:     Effort: Pulmonary effort is normal. No respiratory distress.     Breath sounds: Normal breath sounds. No stridor. No wheezing.  Lymphadenopathy:     Cervical: No cervical adenopathy.  Skin:    General: Skin is warm and dry.  Neurological:     Mental Status: She is alert.           Assessment & Plan:  Rapid strep screen negative  Acute pharyngitis  Acute bilateral serous otitis media  Plan: Hycodan 1 teaspoon p.o. every 8 hours as needed cough.  Zithromax Z-PAK take 2 tablets day 1 followed by 1 tablet days 2 through 5.  Rest and drink plenty of fluids.

## 2019-01-25 NOTE — Patient Instructions (Signed)
Take Zithromax as directed.  2 p.o. day 1 followed by 1 p.o. days 2 through 5.  Hycodan sparingly as needed for cough 1 teaspoon p.o. every 8 hours as needed.  May help with sore throat pain as well.  Rest and drink plenty of fluids.  Rapid strep screen is negative.

## 2019-02-14 ENCOUNTER — Telehealth: Payer: Self-pay

## 2019-02-14 ENCOUNTER — Ambulatory Visit (INDEPENDENT_AMBULATORY_CARE_PROVIDER_SITE_OTHER): Payer: PRIVATE HEALTH INSURANCE | Admitting: Internal Medicine

## 2019-02-14 DIAGNOSIS — J101 Influenza due to other identified influenza virus with other respiratory manifestations: Secondary | ICD-10-CM

## 2019-02-14 DIAGNOSIS — R0602 Shortness of breath: Secondary | ICD-10-CM

## 2019-02-14 DIAGNOSIS — J22 Unspecified acute lower respiratory infection: Secondary | ICD-10-CM

## 2019-02-14 DIAGNOSIS — R05 Cough: Secondary | ICD-10-CM

## 2019-02-14 DIAGNOSIS — R059 Cough, unspecified: Secondary | ICD-10-CM

## 2019-02-14 DIAGNOSIS — R509 Fever, unspecified: Secondary | ICD-10-CM

## 2019-02-14 LAB — POCT INFLUENZA A/B
INFLUENZA B, POC: NEGATIVE
Influenza A, POC: POSITIVE — AB

## 2019-02-14 MED ORDER — HYDROCODONE-HOMATROPINE 5-1.5 MG/5ML PO SYRP: 5.0000 mL | ORAL_SOLUTION | Freq: Three times a day (TID) | ORAL | 0 refills | Status: DC | PRN

## 2019-02-14 MED ORDER — OSELTAMIVIR PHOSPHATE 75 MG PO CAPS: 75.0000 mg | ORAL_CAPSULE | Freq: Two times a day (BID) | ORAL | 0 refills | Status: DC

## 2019-02-14 MED ORDER — DOXYCYCLINE HYCLATE 100 MG PO TABS: 100.0000 mg | ORAL_TABLET | Freq: Two times a day (BID) | ORAL | 0 refills | Status: DC

## 2019-02-14 NOTE — Telephone Encounter (Signed)
Come for car testing

## 2019-02-14 NOTE — Telephone Encounter (Signed)
Patient called she got back from [place] yesterday and as soon she got out of the plane she had a low grade fever cough and congestion some sob when coughing, no CP, vomiting or diarrhea. Last night her temperature went up to 101. She said there were hundreds of cases of COVID-19 where she was, she wants to know if you want her go to somewhere to be tested for COVID-19?

## 2019-02-27 ENCOUNTER — Encounter: Payer: Self-pay | Admitting: Internal Medicine

## 2019-02-27 NOTE — Progress Notes (Signed)
   Subjective:    Patient ID: Meghan Elliott, female    DOB: 16-Mar-1976, 43 y.o.   MRN: [id number]  HPI Traveled to [place] this past weekend for a girls weekend.  Was in several airports and in close contact with people in the airport..  Has myalgias.  She was seen from her vehicle.    Review of Systems no nausea vomiting or diarrhea     Objective:   Physical Exam  Vital signs reviewed.  Rapid strep screen is negative.  Rapid flu test is positive.      Assessment & Plan:  Acute influenza A  Plan: Doxycycline 100 mg twice daily for 10 days for lower respiratory infection.  Hycodan 1 teaspoon p.o. every 8 hours as needed cough.  Tamiflu 75 mg twice daily for 5 days.  Rest and drink plenty of fluids.

## 2019-02-27 NOTE — Patient Instructions (Signed)
Tamiflu 75 mg twice daily for 5 days.  Hycodan 1 teaspoon p.o. every 8 hours as needed cough.  Doxycycline 100 mg twice daily for 10 days for lower respiratory infection.  Rest and drink plenty of fluids.

## 2019-05-11 ENCOUNTER — Other Ambulatory Visit (HOSPITAL_COMMUNITY): Payer: Self-pay | Admitting: Obstetrics and Gynecology

## 2019-05-11 DIAGNOSIS — Z1231 Encounter for screening mammogram for malignant neoplasm of breast: Secondary | ICD-10-CM

## 2019-06-19 ENCOUNTER — Ambulatory Visit (HOSPITAL_COMMUNITY)
Admission: RE | Admit: 2019-06-19 | Discharge: 2019-06-19 | Disposition: A | Discharge status: Home / Self Care | Payer: PRIVATE HEALTH INSURANCE | Source: Ambulatory Visit | Attending: Obstetrics and Gynecology | Admitting: Obstetrics and Gynecology

## 2019-06-19 ENCOUNTER — Encounter (HOSPITAL_COMMUNITY): Payer: Self-pay

## 2019-06-19 ENCOUNTER — Other Ambulatory Visit: Payer: Self-pay

## 2019-06-19 DIAGNOSIS — Z1231 Encounter for screening mammogram for malignant neoplasm of breast: Secondary | ICD-10-CM | POA: Insufficient documentation

## 2019-10-17 ENCOUNTER — Other Ambulatory Visit: Payer: Self-pay

## 2019-10-17 ENCOUNTER — Other Ambulatory Visit: Payer: PRIVATE HEALTH INSURANCE | Admitting: Internal Medicine

## 2019-10-17 DIAGNOSIS — Z1329 Encounter for screening for other suspected endocrine disorder: Secondary | ICD-10-CM

## 2019-10-17 DIAGNOSIS — R7302 Impaired glucose tolerance (oral): Secondary | ICD-10-CM

## 2019-10-17 DIAGNOSIS — Z1321 Encounter for screening for nutritional disorder: Secondary | ICD-10-CM

## 2019-10-17 DIAGNOSIS — E7800 Pure hypercholesterolemia, unspecified: Secondary | ICD-10-CM

## 2019-10-17 DIAGNOSIS — Z0000 Encounter for general adult medical examination without abnormal findings: Secondary | ICD-10-CM

## 2019-10-18 LAB — CBC WITH DIFFERENTIAL/PLATELET
Absolute Monocytes: 304 cells/uL (ref 200–950)
Basophils Absolute: 46 cells/uL (ref 0–200)
Basophils Relative: 0.5 %
Eosinophils Absolute: 74 cells/uL (ref 15–500)
Eosinophils Relative: 0.8 %
HCT: 37.8 % (ref 35.0–45.0)
Hemoglobin: 12.4 g/dL (ref 11.7–15.5)
Lymphs Abs: 3110 cells/uL (ref 850–3900)
MCH: 29.5 pg (ref 27.0–33.0)
MCHC: 32.8 g/dL (ref 32.0–36.0)
MCV: 90 fL (ref 80.0–100.0)
MPV: 9.8 fL (ref 7.5–12.5)
Monocytes Relative: 3.3 %
Neutro Abs: 5667 cells/uL (ref 1500–7800)
Neutrophils Relative %: 61.6 %
Platelets: 365 10*3/uL (ref 140–400)
RBC: 4.2 10*6/uL (ref 3.80–5.10)
RDW: 12.4 % (ref 11.0–15.0)
Total Lymphocyte: 33.8 %
WBC: 9.2 10*3/uL (ref 3.8–10.8)

## 2019-10-18 LAB — COMPLETE METABOLIC PANEL WITH GFR
AG Ratio: 1.4 (calc) (ref 1.0–2.5)
ALT: 12 U/L (ref 6–29)
AST: 13 U/L (ref 10–35)
Albumin: 3.8 g/dL (ref 3.6–5.1)
Alkaline phosphatase (APISO): 58 U/L (ref 31–125)
BUN: 18 mg/dL (ref 7–25)
CO2: 24 mmol/L (ref 20–32)
Calcium: 8.7 mg/dL (ref 8.6–10.2)
Chloride: 103 mmol/L (ref 98–110)
Creat: 0.8 mg/dL (ref 0.50–1.10)
GFR, Est African American: 103 mL/min/{1.73_m2} (ref >=60)
GFR, Est Non African American: 89 mL/min/{1.73_m2} (ref >=60)
Globulin: 2.8 g/dL (calc) (ref 1.9–3.7)
Glucose, Bld: 98 mg/dL (ref 65–99)
Potassium: 4.8 mmol/L (ref 3.5–5.3)
Sodium: 137 mmol/L (ref 135–146)
Total Bilirubin: 0.3 mg/dL (ref 0.2–1.2)
Total Protein: 6.6 g/dL (ref 6.1–8.1)

## 2019-10-18 LAB — HEMOGLOBIN A1C
Hgb A1c MFr Bld: 5.6 % of total Hgb (ref <5.7)
Mean Plasma Glucose: 114 (calc)
eAG (mmol/L): 6.3 (calc)

## 2019-10-18 LAB — LIPID PANEL
Cholesterol: 206 mg/dL — ABNORMAL HIGH (ref <200)
HDL: 45 mg/dL — ABNORMAL LOW (ref >=50)
LDL Cholesterol (Calc): 138 mg/dL (calc) — ABNORMAL HIGH
Non-HDL Cholesterol (Calc): 161 mg/dL (calc) — ABNORMAL HIGH (ref <130)
Total CHOL/HDL Ratio: 4.6 (calc) (ref <5.0)
Triglycerides: 115 mg/dL (ref <150)

## 2019-10-18 LAB — VITAMIN D 25 HYDROXY (VIT D DEFICIENCY, FRACTURES): Vit D, 25-Hydroxy: 27 ng/mL — ABNORMAL LOW (ref 30–100)

## 2019-10-18 LAB — TSH: TSH: 1.81 mIU/L

## 2019-10-19 ENCOUNTER — Encounter: Payer: Self-pay | Admitting: Internal Medicine

## 2019-10-19 ENCOUNTER — Ambulatory Visit (INDEPENDENT_AMBULATORY_CARE_PROVIDER_SITE_OTHER): Payer: PRIVATE HEALTH INSURANCE | Admitting: Internal Medicine

## 2019-10-19 ENCOUNTER — Other Ambulatory Visit: Payer: Self-pay

## 2019-10-19 VITALS — BP 120/80 | HR 102 | Ht 64.0 in | Wt 219.0 lb

## 2019-10-19 DIAGNOSIS — Z0000 Encounter for general adult medical examination without abnormal findings: Secondary | ICD-10-CM | POA: Diagnosis not present

## 2019-10-19 DIAGNOSIS — Z23 Encounter for immunization: Secondary | ICD-10-CM | POA: Diagnosis not present

## 2019-10-19 DIAGNOSIS — Z6837 Body mass index (BMI) 37.0-37.9, adult: Secondary | ICD-10-CM

## 2019-10-19 DIAGNOSIS — E7800 Pure hypercholesterolemia, unspecified: Secondary | ICD-10-CM

## 2019-10-19 NOTE — Progress Notes (Signed)
   Subjective:    Patient ID: Meghan Elliott, female    DOB: 1976-05-17, 43 y.o.   MRN: [id number]  HPI 43 year old Female for health maintenance exam and evaluation of medical issues. Hx of impaired glucose tolerance.  She first presented to this office in 2003.  At that time she weighed 205 pounds.  By 2007 she weighed 226 pounds and developed elevated blood pressure.  She was smoking and was on oral contraceptives.  Subsequently she had an IUD placed and began to exercise and lose weight.  June 2007 she weighed 199 pounds.  Hemoglobin A1c was 6.3% in May 2007.  In March 2010 she weighed 146 pounds and her hemoglobin A1c was 5.3%.  She began to train for triathlons.  No history of accidents or operations.  1 pregnancy.  Allergic to penicillin.  Had tetanus immunization at [facility] in  December 2014  In 2015 she was seen by Dr. Fuller Plan regarding diarrhea after course of Cipro and clindamycin for cat bite which was prescribed in the emergency department.  It was suspected she had antibiotic associated diarrhea or perhaps partially treated C. difficile.  She was given a course of Flagyl and diarrhea resolved.  Social history: She works as Glass blower/designer for [employer].  Completed high school.  She is married.  Quit smoking in 2010.  One daughter in the [place] with the baby.  Patient resides in [place].  Family history: Mother died at 56 with stroke and diabetes complications.  Father died accidentally with injury.  1 sister with insulin-dependent diabetes.  Younger sister with history of ovarian cancer doing okay.  Review of the labs shows vitamin D deficiency at 60.  Hemoglobin A1c 5.6%.  Improvement in total cholesterol from 235 2 years ago to 206.  Has low HDL of 45.  LDL has decreased from 1 62-1 38.  CBC and C met are normal.  She will be placed on high-dose vitamin D for 3 months then should take 4000 units vitamin D3 daily.  She does not want to be on statin  therapy.  Review of Systems jogging 4 miles twice a week and 7 or 8 miles once on weekends.     Objective:   Physical Exam Blood pressure 120/80.  BMI 37.59.  Weight 219 pounds.  Last year weighed 203 pounds.  In 2018 weight 201 pounds.  Skin warm and dry.  Nodes none.  TMs clear.  Neck is supple without JVD thyromegaly or carotid bruits.  Chest clear.  Breast without masses.  Cardiac exam regular rate and rhythm normal S1 and S2 without murmurs.  Abdomen no hepatosplenomegaly masses or tenderness.  No deformities of the extremities.  No edema.  Neuro no gross focal deficits on brief neurological exam.  GYN exam deferred to GYN physician       Assessment & Plan:  BMI 37.59-continue to work on diet and exercise.  Continue running for exercise.  Low HDL of 45  LDL elevated at 138 with a total cholesterol of 206.  Does not want to be on statin medication.  Continue to work on diet and exercise.  History of impaired glucose tolerance but hemoglobin A1c is normal.  Had flu in Spring 2020.  Flu vaccine given today.  Plan: Suggest follow-up in 6 to 12 months.  Tetanus immunization is up-to-date.  Flu vaccine given.

## 2019-10-20 ENCOUNTER — Other Ambulatory Visit: Payer: Self-pay

## 2019-10-20 LAB — POCT URINALYSIS DIPSTICK
Appearance: NEGATIVE
Bilirubin, UA: NEGATIVE
Blood, UA: NEGATIVE
Glucose, UA: NEGATIVE
Ketones, UA: NEGATIVE
Leukocytes, UA: NEGATIVE
Nitrite, UA: NEGATIVE
Odor: NEGATIVE
Protein, UA: NEGATIVE
Spec Grav, UA: 1.015 (ref 1.010–1.025)
Urobilinogen, UA: 0.2 E.U./dL
pH, UA: 6.5 (ref 5.0–8.0)

## 2019-10-20 MED ORDER — ERGOCALCIFEROL 1.25 MG (50000 UT) PO CAPS: 50000.0000 [IU] | ORAL_CAPSULE | ORAL | 1 refills | Status: DC

## 2019-10-28 NOTE — Patient Instructions (Signed)
Flu vaccine given.  It was a pleasure to see you today.  Continue to work on diet exercise and weight loss and return in 6 to 12 months.

## 2020-01-19 ENCOUNTER — Other Ambulatory Visit: Payer: Self-pay | Admitting: Internal Medicine

## 2020-02-01 ENCOUNTER — Ambulatory Visit: Payer: PRIVATE HEALTH INSURANCE | Attending: Internal Medicine

## 2020-02-01 DIAGNOSIS — Z23 Encounter for immunization: Secondary | ICD-10-CM | POA: Insufficient documentation

## 2020-02-01 NOTE — Progress Notes (Signed)
   Covid-19 Vaccination Clinic  Name:  Amelya Mabry    MRN: [id number] DOB: [birth year]  02/01/2020  Ms. Delapena was observed post Covid-19 immunization for 15 minutes without incident. She was provided with Vaccine Information Sheet and instruction to access the V-Safe system.   Ms. Moffit was instructed to call 911 with any severe reactions post vaccine: Marland Kitchen Difficulty breathing  . Swelling of face and throat  . A fast heartbeat  . A bad rash all over body  . Dizziness and weakness   Immunizations Administered    Name Date Dose VIS Date Route   Moderna COVID-19 Vaccine 02/01/2020 11:17 AM 0.5 mL 10/31/2019 Intramuscular   Manufacturer: Moderna   Lot: [id number]   NDC: 17471-595-39

## 2020-03-05 ENCOUNTER — Ambulatory Visit: Payer: Self-pay | Attending: Family

## 2020-03-05 DIAGNOSIS — Z23 Encounter for immunization: Secondary | ICD-10-CM

## 2020-03-05 NOTE — Progress Notes (Signed)
   Covid-19 Vaccination Clinic  Name:  [name]    MRN: [id number] DOB: 06/21/1976  03/05/2020  [name] was observed post Covid-19 immunization for 15 minutes without incident. She was provided with Vaccine Information Sheet and instruction to access the V-Safe system.   [name] was instructed to call 911 with any severe reactions post vaccine: Marland Kitchen Difficulty breathing  . Swelling of face and throat  . A fast heartbeat  . A bad rash all over body  . Dizziness and weakness   Immunizations Administered    Name Date Dose VIS Date Route   Moderna COVID-19 Vaccine 03/05/2020 11:36 AM 0.5 mL 10/31/2019 Intramuscular   Manufacturer: Moderna   Lot: [id number]   NDC: 03500-938-18

## 2020-06-07 ENCOUNTER — Other Ambulatory Visit (HOSPITAL_COMMUNITY): Payer: Self-pay | Admitting: Obstetrics and Gynecology

## 2020-06-07 DIAGNOSIS — Z1231 Encounter for screening mammogram for malignant neoplasm of breast: Secondary | ICD-10-CM

## 2020-06-21 ENCOUNTER — Ambulatory Visit (HOSPITAL_COMMUNITY)
Admission: RE | Admit: 2020-06-21 | Discharge: 2020-06-21 | Disposition: A | Discharge status: Home / Self Care | Payer: No Typology Code available for payment source | Source: Ambulatory Visit | Attending: Obstetrics and Gynecology | Admitting: Obstetrics and Gynecology

## 2020-06-21 ENCOUNTER — Other Ambulatory Visit: Payer: Self-pay

## 2020-06-21 DIAGNOSIS — Z1231 Encounter for screening mammogram for malignant neoplasm of breast: Secondary | ICD-10-CM | POA: Insufficient documentation

## 2020-11-07 ENCOUNTER — Other Ambulatory Visit: Payer: PRIVATE HEALTH INSURANCE | Admitting: Internal Medicine

## 2020-11-07 ENCOUNTER — Other Ambulatory Visit: Payer: Self-pay

## 2020-11-07 DIAGNOSIS — E559 Vitamin D deficiency, unspecified: Secondary | ICD-10-CM

## 2020-11-07 DIAGNOSIS — R7302 Impaired glucose tolerance (oral): Secondary | ICD-10-CM

## 2020-11-07 DIAGNOSIS — Z0000 Encounter for general adult medical examination without abnormal findings: Secondary | ICD-10-CM

## 2020-11-07 DIAGNOSIS — E7800 Pure hypercholesterolemia, unspecified: Secondary | ICD-10-CM

## 2020-11-08 LAB — CBC WITH DIFFERENTIAL/PLATELET
Absolute Monocytes: 249 cells/uL (ref 200–950)
Basophils Absolute: 50 cells/uL (ref 0–200)
Basophils Relative: 0.6 %
Eosinophils Absolute: 83 cells/uL (ref 15–500)
Eosinophils Relative: 1 %
HCT: 38.7 % (ref 35.0–45.0)
Hemoglobin: 12.9 g/dL (ref 11.7–15.5)
Lymphs Abs: 3179 cells/uL (ref 850–3900)
MCH: 30.4 pg (ref 27.0–33.0)
MCHC: 33.3 g/dL (ref 32.0–36.0)
MCV: 91.1 fL (ref 80.0–100.0)
MPV: 9.5 fL (ref 7.5–12.5)
Monocytes Relative: 3 %
Neutro Abs: 4739 cells/uL (ref 1500–7800)
Neutrophils Relative %: 57.1 %
Platelets: 386 10*3/uL (ref 140–400)
RBC: 4.25 10*6/uL (ref 3.80–5.10)
RDW: 12.1 % (ref 11.0–15.0)
Total Lymphocyte: 38.3 %
WBC: 8.3 10*3/uL (ref 3.8–10.8)

## 2020-11-08 LAB — COMPLETE METABOLIC PANEL WITH GFR
AG Ratio: 1.2 (calc) (ref 1.0–2.5)
ALT: 12 U/L (ref 6–29)
AST: 11 U/L (ref 10–35)
Albumin: 4 g/dL (ref 3.6–5.1)
Alkaline phosphatase (APISO): 54 U/L (ref 31–125)
BUN: 17 mg/dL (ref 7–25)
CO2: 25 mmol/L (ref 20–32)
Calcium: 8.9 mg/dL (ref 8.6–10.2)
Chloride: 102 mmol/L (ref 98–110)
Creat: 0.76 mg/dL (ref 0.50–1.10)
GFR, Est African American: 109 mL/min/{1.73_m2} (ref >=60)
GFR, Est Non African American: 94 mL/min/{1.73_m2} (ref >=60)
Globulin: 3.3 g/dL (calc) (ref 1.9–3.7)
Glucose, Bld: 100 mg/dL — ABNORMAL HIGH (ref 65–99)
Potassium: 4.6 mmol/L (ref 3.5–5.3)
Sodium: 137 mmol/L (ref 135–146)
Total Bilirubin: 0.5 mg/dL (ref 0.2–1.2)
Total Protein: 7.3 g/dL (ref 6.1–8.1)

## 2020-11-08 LAB — LIPID PANEL
Cholesterol: 209 mg/dL — ABNORMAL HIGH (ref <200)
HDL: 44 mg/dL — ABNORMAL LOW (ref >=50)
LDL Cholesterol (Calc): 144 mg/dL (calc) — ABNORMAL HIGH
Non-HDL Cholesterol (Calc): 165 mg/dL (calc) — ABNORMAL HIGH (ref <130)
Total CHOL/HDL Ratio: 4.8 (calc) (ref <5.0)
Triglycerides: 100 mg/dL (ref <150)

## 2020-11-08 LAB — TSH: TSH: 2.13 mIU/L

## 2020-11-08 LAB — HEMOGLOBIN A1C
Hgb A1c MFr Bld: 5.5 % of total Hgb (ref <5.7)
Mean Plasma Glucose: 111 mg/dL
eAG (mmol/L): 6.2 mmol/L

## 2020-11-11 ENCOUNTER — Telehealth: Payer: Self-pay | Admitting: Internal Medicine

## 2020-11-11 ENCOUNTER — Telehealth: Payer: Self-pay

## 2020-11-11 ENCOUNTER — Other Ambulatory Visit: Payer: Self-pay

## 2020-11-11 ENCOUNTER — Ambulatory Visit (INDEPENDENT_AMBULATORY_CARE_PROVIDER_SITE_OTHER): Payer: PRIVATE HEALTH INSURANCE | Admitting: Internal Medicine

## 2020-11-11 VITALS — HR 94 | Temp 99.4°F

## 2020-11-11 DIAGNOSIS — J0100 Acute maxillary sinusitis, unspecified: Secondary | ICD-10-CM

## 2020-11-11 MED ORDER — HYDROCODONE-HOMATROPINE 5-1.5 MG/5ML PO SYRP
5.0000 mL | ORAL_SOLUTION | Freq: Three times a day (TID) | ORAL | 0 refills | Status: DC | PRN
Start: 2020-11-11 — End: 2020-12-03

## 2020-11-11 MED ORDER — AZITHROMYCIN 250 MG PO TABS: ORAL_TABLET | ORAL | 0 refills | Status: DC

## 2020-11-11 NOTE — Telephone Encounter (Signed)
Meghan Elliott [phone]  [name] called to say she is stuffy and coughing, she also says she has nose and head congestion, this all started Saturday evening. She is at work, they said she could come in as long as she wore mask. She had her booster 3 1/2 weeks ago. She has been taking Nyquil since symptoms started.

## 2020-11-11 NOTE — Telephone Encounter (Signed)
Car visit 

## 2020-11-11 NOTE — Progress Notes (Signed)
   Subjective:    Patient ID: Meghan Elliott, female    DOB: [birth year], 44 y.o.   MRN: [id number]  HPI 44 year old Female seen acutely today for respiratory congestion and maxillary sinus discomfort. Some slight cough from nasal drainage. No fever or chills. Has had Covid-19 vaccines and had planned to get flu vaccine this week. Works at [employer] around others.   Obtained Covid-19 test and Respiratory virus panel today.  Review of Systems see above     Objective:   Physical Exam  Temperature 99.4 degrees pulse 94 regular pulse oximetry 99%. TMs are clear. Chest is clear to auscultation. No tachypnea. Pharynx is clear.     Assessment & Plan:  Acute maxillary sinusitis  Plan: COVID-19 and respiratory virus panels obtained. For some reason respiratory virus panel was not resulted by [facility]. However, COVID-19 test is negative. Zithromax Z-PAK 2 p.o. day 1 followed by 1 p.o. days 2 through 5.

## 2020-11-11 NOTE — Telephone Encounter (Signed)
Scheduled

## 2020-11-11 NOTE — Telephone Encounter (Signed)
Clarified with [facility] regarding questioning tests.

## 2020-11-12 ENCOUNTER — Encounter: Payer: PRIVATE HEALTH INSURANCE | Admitting: Internal Medicine

## 2020-11-12 MED ORDER — BENZONATATE 100 MG PO CAPS: ORAL_CAPSULE | ORAL | 0 refills | Status: DC

## 2020-11-13 ENCOUNTER — Telehealth: Payer: Self-pay | Admitting: Internal Medicine

## 2020-11-13 ENCOUNTER — Telehealth: Payer: Self-pay

## 2020-11-13 NOTE — Telephone Encounter (Signed)
Called [facility] and requested they process covid test stat.  They state they do not do this stat but have sent a request to have it processes as soon as possible.

## 2020-11-13 NOTE — Telephone Encounter (Signed)
Asking [facility] if Covid can be run STAT today

## 2020-11-16 LAB — SARS-COV-2 RNA (COVID-19) RESP VIRAL PNL QL NAAT

## 2020-11-16 LAB — TIQ-NTM

## 2020-12-02 ENCOUNTER — Encounter: Payer: PRIVATE HEALTH INSURANCE | Admitting: Internal Medicine

## 2020-12-03 ENCOUNTER — Ambulatory Visit (INDEPENDENT_AMBULATORY_CARE_PROVIDER_SITE_OTHER): Payer: PRIVATE HEALTH INSURANCE | Admitting: Internal Medicine

## 2020-12-03 ENCOUNTER — Other Ambulatory Visit: Payer: Self-pay

## 2020-12-03 ENCOUNTER — Encounter: Payer: Self-pay | Admitting: Internal Medicine

## 2020-12-03 VITALS — BP 110/80 | HR 97 | Ht 64.0 in | Wt 203.0 lb

## 2020-12-03 DIAGNOSIS — Z6834 Body mass index (BMI) 34.0-34.9, adult: Secondary | ICD-10-CM

## 2020-12-03 DIAGNOSIS — Z23 Encounter for immunization: Secondary | ICD-10-CM

## 2020-12-03 DIAGNOSIS — E7800 Pure hypercholesterolemia, unspecified: Secondary | ICD-10-CM

## 2020-12-03 DIAGNOSIS — Z0000 Encounter for general adult medical examination without abnormal findings: Secondary | ICD-10-CM

## 2020-12-03 DIAGNOSIS — R748 Abnormal levels of other serum enzymes: Secondary | ICD-10-CM | POA: Diagnosis not present

## 2020-12-03 LAB — POCT URINALYSIS DIPSTICK
Appearance: NEGATIVE
Bilirubin, UA: NEGATIVE
Blood, UA: NEGATIVE
Glucose, UA: NEGATIVE
Ketones, UA: NEGATIVE
Leukocytes, UA: NEGATIVE
Nitrite, UA: NEGATIVE
Odor: NEGATIVE
Protein, UA: NEGATIVE
Spec Grav, UA: 1.015 (ref 1.010–1.025)
Urobilinogen, UA: 0.2 E.U./dL
pH, UA: 6.5 (ref 5.0–8.0)

## 2020-12-03 NOTE — Patient Instructions (Signed)
Flu vaccine given. It was a pleasure to see today. Keep up the good work with weight loss. Continue diet and exercise regimen. RTC in one year or as needed.

## 2020-12-03 NOTE — Progress Notes (Signed)
   Subjective:    Patient ID: Meghan Elliott, female    DOB: 15-Dec-1975, 45 y.o.   MRN: [id number]  HPI  45 year old Female seen for health maintenance examination.  She has a history of impaired glucose tolerance.  She first presented to this office in 2003 and at that time weighed 205 pounds.  At 2007 she weighed 226 pounds and developed elevated blood pressure.  She was smoking and was on oral contraceptives.  Subsequently she had an IUD placed and began to exercise.  By June 2007 she weighed 199 pounds.  Hemoglobin A1c was 6.3% in May 2007.  March 2018 she weighed 146 pounds and her hemoglobin A1c was 5.3%.  She began to train for triathlons.  She continues to exercise.  Walks a bit and runs a bit and is getting back into shape.  No history of accidents or operations.  One pregnancy.  She is allergic to Penicillin.  History of vitamin D deficiency in 2020 and was placed on high-dose vitamin D for 3 months and then was advised to take over-the-counter vitamin D3.  History of elevated LDL cholesterol.  Does not want to be on statin therapy.  Social history: She works as Glass blower/designer for [employer].  Completed high school.  She is married.  Quit smoking in 2010.  One daughter.  [place]  Family history: Mother died at age 61 with stroke and diabetes complications.  Father died accidentally with injury.  One sister with insulin-dependent diabetes.  Younger sister with history of ovarian cancer doing okay.  Dipstick urine is normal.  Total cholesterol is 209, HDL cholesterol low at 44, LDL is elevated at 144.  Last year LDL was 138 and 3 years ago it was 162.  CBC is normal.  C-Met is 5.5%.  TSH is normal.  On November 11, 2020 she called complaining of head and nasal congestion with cough.  She has had all three Covid immunizations.  Respiratory virus panel was negative and COVID-19 test was also negative.    Review of Systems no new complaints      Objective:   Physical Exam Weight decreased 16 pounds past year.  Blood pressure 110/80 pulse 97 pulse oximetry 98% weight 203 pounds.  Height 5 feet 4 inches BMI 34.84.  Skin warm and dry.  Nodes none.  TMs are clear.  Neck is supple without thyromegaly or carotid bruits.  Chest clear to auscultation.  Cardiac exam regular rate and rhythm normal S1 and S2 without murmurs.  Breast without masses.  Abdomen soft nondistended without hepatosplenomegaly masses or tenderness.  Lower extremities without edema or deformity.  Neuro intact without focal deficits.  Affect thought and judgment are normal.       Assessment & Plan:  BMI 34 and previously was 37.59 in November 2020.  Continue running for exercise and continue working on diet.  History of low HDL-currently it is 44 and was as high as 50 when checked 3 years ago.  History of impaired glucose tolerance.  Highest A1c was 4 years ago at 5.8%.  It is now normal at 5.5%.  She continues to watch her diet and is not on glucose lowering medication.  Health maintenance-flu vaccine given today.  Has had two maternal COVID-19 immunizations in March and April 2021.

## 2020-12-22 ENCOUNTER — Encounter: Payer: Self-pay | Admitting: Internal Medicine

## 2020-12-22 NOTE — Patient Instructions (Signed)
COVID-19 PCR test obtained and is pending. Take Zithromax Z-PAK 2 p.o. day 1 followed by 1 p.o. days 2 through 5.

## 2021-05-29 ENCOUNTER — Other Ambulatory Visit (HOSPITAL_COMMUNITY): Payer: Self-pay | Admitting: Obstetrics

## 2021-05-29 DIAGNOSIS — Z1231 Encounter for screening mammogram for malignant neoplasm of breast: Secondary | ICD-10-CM

## 2021-06-06 ENCOUNTER — Ambulatory Visit: Payer: No Typology Code available for payment source | Admitting: Internal Medicine

## 2021-06-06 ENCOUNTER — Telehealth: Payer: Self-pay | Admitting: Internal Medicine

## 2021-06-06 ENCOUNTER — Other Ambulatory Visit: Payer: Self-pay

## 2021-06-06 ENCOUNTER — Encounter: Payer: Self-pay | Admitting: Internal Medicine

## 2021-06-06 VITALS — BP 120/80 | HR 88 | Temp 98.5°F | Wt 216.0 lb

## 2021-06-06 DIAGNOSIS — L237 Allergic contact dermatitis due to plants, except food: Secondary | ICD-10-CM

## 2021-06-06 MED ORDER — PREDNISONE 10 MG PO TABS: ORAL_TABLET | ORAL | 1 refills | Status: DC

## 2021-06-06 NOTE — Telephone Encounter (Addendum)
Meghan Elliott [phone]  [name] called to say she had gotten into some poison oak and has it on her face and left eye and on her right arm. Scheduled her for office visit. Has had COVID vaccines, no exposure that she knows of. Leaving to go out of time this weekend.

## 2021-06-06 NOTE — Progress Notes (Signed)
   Subjective:    Patient ID: Meghan Elliott, female    DOB: [birth year], 45 y.o.   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 Has had symptoms for several days and feels it is worse. Has several lesion left periorbital area but eye is not swollen shut and there is no eye discharge. Also has lesions nose, arms and chin.  Had CPE in January 2022.History of impaired glucose tolerance that reversed with diet, exercise and weight loss. Hgb AIC December 2020 normal at 5.5%  No hx of accidents or operations. One pregnancy. Allergic to Penicillin. Hx of Vitamin D deficiency. Level was 27 October 2019.   Review of Systems see above     Objective:   Physical Exam  BP 120/80, pulse 88 ,Temp 98.5 degrees, pulse ox 97% ,weight 216 pounds  Has erythematous linear lesions x 3 left temple. Similar lesions nose chins and arms. These are not draining or weeping.      Assessment & Plan:   Poison ivy involving face and arms  Plan: Prednisone 10 mg (#21) to take in tapering course starting with 6 tabs day 1 and decrease by 1 tab daily 6-5-4-3-2-1 taper with 1 refill.

## 2021-06-06 NOTE — Patient Instructions (Signed)
Take prednisone in tapering course as directed 6-5-4-3-2-1. You have one refill on this prescription. It was a pleasure to see you today.

## 2021-06-23 ENCOUNTER — Ambulatory Visit (HOSPITAL_COMMUNITY)
Admission: RE | Admit: 2021-06-23 | Discharge: 2021-06-23 | Disposition: A | Discharge status: Home / Self Care | Payer: No Typology Code available for payment source | Source: Ambulatory Visit | Attending: Obstetrics | Admitting: Obstetrics

## 2021-06-23 ENCOUNTER — Other Ambulatory Visit: Payer: Self-pay

## 2021-06-23 DIAGNOSIS — Z1231 Encounter for screening mammogram for malignant neoplasm of breast: Secondary | ICD-10-CM | POA: Insufficient documentation

## 2021-06-24 ENCOUNTER — Other Ambulatory Visit (HOSPITAL_COMMUNITY): Payer: Self-pay | Admitting: Obstetrics

## 2021-06-25 ENCOUNTER — Other Ambulatory Visit (HOSPITAL_COMMUNITY): Payer: Self-pay | Admitting: Obstetrics

## 2021-06-25 DIAGNOSIS — R928 Other abnormal and inconclusive findings on diagnostic imaging of breast: Secondary | ICD-10-CM

## 2021-06-27 ENCOUNTER — Other Ambulatory Visit: Payer: Self-pay

## 2021-06-27 ENCOUNTER — Encounter (HOSPITAL_COMMUNITY): Payer: Self-pay

## 2021-06-27 ENCOUNTER — Ambulatory Visit (HOSPITAL_COMMUNITY)
Admission: RE | Admit: 2021-06-27 | Discharge: 2021-06-27 | Disposition: A | Discharge status: Home / Self Care | Payer: No Typology Code available for payment source | Source: Ambulatory Visit | Attending: Obstetrics | Admitting: Obstetrics

## 2021-06-27 DIAGNOSIS — R928 Other abnormal and inconclusive findings on diagnostic imaging of breast: Secondary | ICD-10-CM

## 2022-01-13 ENCOUNTER — Other Ambulatory Visit: Payer: Self-pay

## 2022-01-13 ENCOUNTER — Other Ambulatory Visit: Payer: PRIVATE HEALTH INSURANCE | Admitting: Internal Medicine

## 2022-01-13 DIAGNOSIS — E7800 Pure hypercholesterolemia, unspecified: Secondary | ICD-10-CM

## 2022-01-13 DIAGNOSIS — Z1329 Encounter for screening for other suspected endocrine disorder: Secondary | ICD-10-CM

## 2022-01-13 DIAGNOSIS — Z0000 Encounter for general adult medical examination without abnormal findings: Secondary | ICD-10-CM

## 2022-01-14 LAB — COMPLETE METABOLIC PANEL WITH GFR
AG Ratio: 1.4 (calc) (ref 1.0–2.5)
ALT: 28 U/L (ref 6–29)
AST: 21 U/L (ref 10–35)
Albumin: 4.3 g/dL (ref 3.6–5.1)
Alkaline phosphatase (APISO): 65 U/L (ref 31–125)
BUN: 19 mg/dL (ref 7–25)
CO2: 27 mmol/L (ref 20–32)
Calcium: 9.1 mg/dL (ref 8.6–10.2)
Chloride: 104 mmol/L (ref 98–110)
Creat: 0.77 mg/dL (ref 0.50–0.99)
Globulin: 3 g/dL (calc) (ref 1.9–3.7)
Glucose, Bld: 97 mg/dL (ref 65–99)
Potassium: 4.5 mmol/L (ref 3.5–5.3)
Sodium: 140 mmol/L (ref 135–146)
Total Bilirubin: 0.4 mg/dL (ref 0.2–1.2)
Total Protein: 7.3 g/dL (ref 6.1–8.1)
eGFR: 96 mL/min/{1.73_m2} (ref >=60)

## 2022-01-14 LAB — CBC WITH DIFFERENTIAL/PLATELET
Absolute Monocytes: 262 cells/uL (ref 200–950)
Basophils Absolute: 39 cells/uL (ref 0–200)
Basophils Relative: 0.5 %
Eosinophils Absolute: 92 cells/uL (ref 15–500)
Eosinophils Relative: 1.2 %
HCT: 40.2 % (ref 35.0–45.0)
Hemoglobin: 13.3 g/dL (ref 11.7–15.5)
Lymphs Abs: 3219 cells/uL (ref 850–3900)
MCH: 30.3 pg (ref 27.0–33.0)
MCHC: 33.1 g/dL (ref 32.0–36.0)
MCV: 91.6 fL (ref 80.0–100.0)
MPV: 9.1 fL (ref 7.5–12.5)
Monocytes Relative: 3.4 %
Neutro Abs: 4089 cells/uL (ref 1500–7800)
Neutrophils Relative %: 53.1 %
Platelets: 357 10*3/uL (ref 140–400)
RBC: 4.39 10*6/uL (ref 3.80–5.10)
RDW: 11.9 % (ref 11.0–15.0)
Total Lymphocyte: 41.8 %
WBC: 7.7 10*3/uL (ref 3.8–10.8)

## 2022-01-14 LAB — LIPID PANEL
Cholesterol: 234 mg/dL — ABNORMAL HIGH (ref <200)
HDL: 48 mg/dL — ABNORMAL LOW (ref >=50)
LDL Cholesterol (Calc): 168 mg/dL (calc) — ABNORMAL HIGH
Non-HDL Cholesterol (Calc): 186 mg/dL (calc) — ABNORMAL HIGH (ref <130)
Total CHOL/HDL Ratio: 4.9 (calc) (ref <5.0)
Triglycerides: 78 mg/dL (ref <150)

## 2022-01-14 LAB — TSH: TSH: 1.26 mIU/L

## 2022-01-19 ENCOUNTER — Other Ambulatory Visit: Payer: No Typology Code available for payment source | Admitting: Internal Medicine

## 2022-01-20 ENCOUNTER — Encounter: Payer: Self-pay | Admitting: Internal Medicine

## 2022-01-20 ENCOUNTER — Ambulatory Visit (INDEPENDENT_AMBULATORY_CARE_PROVIDER_SITE_OTHER): Payer: PRIVATE HEALTH INSURANCE | Admitting: Internal Medicine

## 2022-01-20 ENCOUNTER — Other Ambulatory Visit: Payer: Self-pay

## 2022-01-20 VITALS — BP 118/68 | HR 99 | Temp 98.3°F | Ht 64.5 in | Wt 225.8 lb

## 2022-01-20 DIAGNOSIS — Z6838 Body mass index (BMI) 38.0-38.9, adult: Secondary | ICD-10-CM | POA: Diagnosis not present

## 2022-01-20 DIAGNOSIS — Z8249 Family history of ischemic heart disease and other diseases of the circulatory system: Secondary | ICD-10-CM | POA: Diagnosis not present

## 2022-01-20 DIAGNOSIS — R748 Abnormal levels of other serum enzymes: Secondary | ICD-10-CM | POA: Diagnosis not present

## 2022-01-20 DIAGNOSIS — E7800 Pure hypercholesterolemia, unspecified: Secondary | ICD-10-CM

## 2022-01-20 DIAGNOSIS — Z0000 Encounter for general adult medical examination without abnormal findings: Secondary | ICD-10-CM | POA: Diagnosis not present

## 2022-01-20 LAB — POCT URINALYSIS DIPSTICK
Bilirubin, UA: NEGATIVE
Blood, UA: NEGATIVE
Glucose, UA: NEGATIVE
Ketones, UA: NEGATIVE
Leukocytes, UA: NEGATIVE
Nitrite, UA: NEGATIVE
Protein, UA: NEGATIVE
Spec Grav, UA: <=1.005 — AB (ref 1.010–1.025)
Urobilinogen, UA: 0.2 E.U./dL
pH, UA: 5 (ref 5.0–8.0)

## 2022-01-20 NOTE — Progress Notes (Signed)
Subjective:    Patient ID: Meghan Elliott, female    DOB: 22-Jul-1976, 46 y.o.   MRN: [id number]  HPI 46 year old Female seen for health maintenance exam and evaluation of medical issues.  She has a history of impaired glucose tolerance.  She presented to this office for the first time in 2003 and at that time weighed 205 pounds.  In 2007 she weighed 226 pounds and developed elevated blood pressure.  She was smoking and was on oral contraceptives.  She subsequently had an IUD placed and began to exercise.  By June 2007 she weighed 199 pounds.  Hemoglobin A1c was 6.3 in May 2007.  In March 2018 she weighed 146 pounds and her hemoglobin A1c was 5.3%.  She began to train for triathlons.  She continues to exercise.  She is getting back into shape.  No history of accidents or operations.  1 pregnancy.  She is allergic to Penicillin  History of vitamin D deficiency in 2020.  She was treated with high-dose vitamin D for 3 months and was then advised to take over-the-counter vitamin D3  History of pure hypercholesterolemia.  Total cholesterol is 235, triglycerides 78, HDL 48 and LDL 168.  Has not wanted to be on statin medication although it has been recommended at low-dose.  Has low HDL.  4 years ago it was 33 which is the highest its been in several years.  Social history: Works as Print production planner for a [employer].  Completed high school.  She is married.  Quit smoking in 2010.  1 daughter.  Resides in [place].  Family history: Mother died at age 72 with stroke and diabetes complication.  Father died accidentally with an injury.  1 sister with insulin-dependent diabetes.  Younger sister with history of ovarian cancer doing okay.  She has had influenza vaccine this season.  I records indicate for COVID vaccines the last 112 3022.  Tetanus immunization is up-to-date.      Review of Systems Mammogram done in July     Objective:   Physical Exam Vitals reviewed.  Constitutional:       General: She is not in acute distress.    Appearance: Normal appearance.  HENT:     Head: Normocephalic.     Right Ear: Tympanic membrane normal.     Left Ear: Tympanic membrane normal.     Nose: Nose normal.     Mouth/Throat:     Pharynx: Oropharynx is clear.  Eyes:     General: No scleral icterus.       Right eye: No discharge.        Left eye: No discharge.     Extraocular Movements: Extraocular movements intact.     Pupils: Pupils are equal, round, and reactive to light.  Neck:     Vascular: No carotid bruit.  Cardiovascular:     Rate and Rhythm: Normal rate and regular rhythm.     Heart sounds: Normal heart sounds. No murmur heard. Pulmonary:     Effort: Pulmonary effort is normal.     Breath sounds: Normal breath sounds. No rales.  Abdominal:     General: Bowel sounds are normal.     Palpations: Abdomen is soft. There is no mass.     Tenderness: There is no guarding or rebound.  Musculoskeletal:     Cervical back: Neck supple. No rigidity.     Right lower leg: No edema.     Left lower leg: No edema.  Lymphadenopathy:     Cervical: No cervical adenopathy.  Skin:    General: Skin is warm.     Findings: No rash.  Neurological:     General: No focal deficit present.     Mental Status: She is alert and oriented to person, place, and time.     Comments: Brief neurological exam intact without focal deficits  Psychiatric:        Mood and Affect: Mood normal.        Behavior: Behavior normal.        Thought Content: Thought content normal.        Judgment: Judgment normal.          Assessment & Plan:   Pure hypercholesterolemia-does not want to be on statin medication.  Continue with diet and exercise efforts.  Low HDL longstanding  BMI 38.15-continue diet and exercise efforts  Plan: Return in 1 year or as needed.  CT calcium score ordered

## 2022-01-20 NOTE — Patient Instructions (Addendum)
Coronary calcium scoring ordered.  Continue to work on diet and exercise efforts.  It was a pleasure to see you today.

## 2022-02-04 ENCOUNTER — Ambulatory Visit (HOSPITAL_COMMUNITY)
Admission: RE | Admit: 2022-02-04 | Discharge: 2022-02-04 | Disposition: A | Discharge status: Home / Self Care | Payer: Self-pay | Source: Ambulatory Visit | Attending: Internal Medicine | Admitting: Internal Medicine

## 2022-02-04 ENCOUNTER — Other Ambulatory Visit: Payer: Self-pay

## 2022-02-04 DIAGNOSIS — Z8249 Family history of ischemic heart disease and other diseases of the circulatory system: Secondary | ICD-10-CM | POA: Insufficient documentation

## 2022-03-18 ENCOUNTER — Telehealth: Payer: Self-pay | Admitting: Internal Medicine

## 2022-03-18 NOTE — Telephone Encounter (Signed)
[name] ?[phone] ? ?Meghan Elliott called to say she thinks she may have UTI, Frequent urination, burning, feeling bad, started yesterday. I have scheduled her for tomorrow. ?

## 2022-03-19 ENCOUNTER — Encounter: Payer: Self-pay | Admitting: Internal Medicine

## 2022-03-19 ENCOUNTER — Ambulatory Visit: Payer: PRIVATE HEALTH INSURANCE | Admitting: Internal Medicine

## 2022-03-19 VITALS — BP 128/86 | HR 89 | Temp 98.5°F | Ht 64.5 in | Wt 223.5 lb

## 2022-03-19 DIAGNOSIS — R300 Dysuria: Secondary | ICD-10-CM | POA: Diagnosis not present

## 2022-03-19 LAB — POCT URINALYSIS DIPSTICK
Bilirubin, UA: NEGATIVE
Blood, UA: NEGATIVE
Glucose, UA: NEGATIVE
Ketones, UA: NEGATIVE
Leukocytes, UA: NEGATIVE
Nitrite, UA: NEGATIVE
Protein, UA: NEGATIVE
Spec Grav, UA: <=1.005 — AB (ref 1.010–1.025)
Urobilinogen, UA: 0.2 E.U./dL
pH, UA: 6.5 (ref 5.0–8.0)

## 2022-03-19 MED ORDER — CIPROFLOXACIN HCL 500 MG PO TABS: ORAL_TABLET | ORAL | 0 refills | Status: DC

## 2022-03-19 MED ORDER — FLUCONAZOLE 150 MG PO TABS: 150.0000 mg | ORAL_TABLET | Freq: Once | ORAL | 0 refills | Status: AC

## 2022-03-19 NOTE — Progress Notes (Signed)
? ?  Subjective:  ? ? Patient ID: Meghan Elliott, female    DOB: Jun 01, 1976, 46 y.o.   MRN: [id number] ? ?HPI 46 year old Female seen for urinary frequency onset yesterday.  Slight right sided back pain. No fever or chills. No nausea or vomiting.  Also has dysuria.  No hematuria.  No shaking chills.  Usually can hold urine for fairly long period of time but not as of yesterday when symptoms started.  No incontinence. ? ?She was seen here January 20, 2022 for health maintenance exam.  Urine specimen at that time was normal except for low specific gravity of less than 1.005  felt to be due to drinking lots of water.  She has been a patient here since 2003.  No history of accidents or operations.  1 pregnancy. ? ?She is allergic to Penicillin. ? ?History of vitamin D deficiency.  History of pure hypercholesterolemia.  Has low HDL cholesterol. ? ?Works as the Print production planner for [employer].  Quit smoking in 2010.  She is married.  1 daughter.  Resides in [place]. ? ? ?Review of Systems she says the last time that she had a UTI was 27 years ago. ? ?   ?Objective:  ? Physical Exam ? ?Blood pressure 128/86 pulse 89 temperature 98.5 degrees pulse oximetry 98% weight 223 pounds 8 ounces BMI 37.77 ? ?Dipstick UA is unremarkable except for low specific gravity of less than 1.005.  She has been drinking lots of water.  In this specimen there is no LE or nitrite. ?   ?Assessment & Plan:  ?-Urinary frequency and dysuria-concerning for acute UTI.  Culture was sent.  If culture is negative, then she likely has urethritis.  No hematuria noted on urine dipstick so kidney stone less likely. ? ? ? ?Plan: She will be placed on Cipro 500 mg twice daily for 7 days.  Urine has been cultured.  May take Diflucan 150 mg tablet if develops Candida vaginitis while on antibiotics.  May take AZO standard for dysuria call if symptoms worsen or not improving in 48 hours. ?

## 2022-03-19 NOTE — Patient Instructions (Addendum)
Take Cipro 500 mg twice daily for 7 days.  May take AZO standard for dysuria.  Call if symptoms not improving in 48 hours or sooner if worse.  May take Diflucan if needed for Candida vaginitis. ?

## 2022-03-21 LAB — URINE CULTURE
MICRO NUMBER:: 13290149
SPECIMEN QUALITY:: ADEQUATE

## 2022-05-28 ENCOUNTER — Other Ambulatory Visit (HOSPITAL_COMMUNITY): Payer: Self-pay | Admitting: Internal Medicine

## 2022-05-28 DIAGNOSIS — Z1231 Encounter for screening mammogram for malignant neoplasm of breast: Secondary | ICD-10-CM

## 2022-06-24 ENCOUNTER — Ambulatory Visit (HOSPITAL_COMMUNITY)
Admission: RE | Admit: 2022-06-24 | Discharge: 2022-06-24 | Disposition: A | Discharge status: Home / Self Care | Payer: PRIVATE HEALTH INSURANCE | Source: Ambulatory Visit | Attending: Internal Medicine | Admitting: Internal Medicine

## 2022-06-24 DIAGNOSIS — Z1231 Encounter for screening mammogram for malignant neoplasm of breast: Secondary | ICD-10-CM | POA: Insufficient documentation

## 2022-06-25 ENCOUNTER — Other Ambulatory Visit (HOSPITAL_COMMUNITY): Payer: Self-pay | Admitting: Internal Medicine

## 2022-06-25 DIAGNOSIS — R928 Other abnormal and inconclusive findings on diagnostic imaging of breast: Secondary | ICD-10-CM

## 2022-07-20 ENCOUNTER — Other Ambulatory Visit: Payer: PRIVATE HEALTH INSURANCE

## 2022-07-20 DIAGNOSIS — E7800 Pure hypercholesterolemia, unspecified: Secondary | ICD-10-CM

## 2022-07-21 ENCOUNTER — Encounter (HOSPITAL_COMMUNITY): Payer: Self-pay

## 2022-07-21 ENCOUNTER — Ambulatory Visit (HOSPITAL_COMMUNITY)
Admission: RE | Admit: 2022-07-21 | Discharge: 2022-07-21 | Disposition: A | Discharge status: Home / Self Care | Payer: PRIVATE HEALTH INSURANCE | Source: Ambulatory Visit | Attending: Internal Medicine | Admitting: Internal Medicine

## 2022-07-21 DIAGNOSIS — R928 Other abnormal and inconclusive findings on diagnostic imaging of breast: Secondary | ICD-10-CM

## 2022-07-21 LAB — LIPID PANEL
Cholesterol: 187 mg/dL (ref <200)
HDL: 44 mg/dL — ABNORMAL LOW (ref >=50)
LDL Cholesterol (Calc): 122 mg/dL (calc) — ABNORMAL HIGH
Non-HDL Cholesterol (Calc): 143 mg/dL (calc) — ABNORMAL HIGH (ref <130)
Total CHOL/HDL Ratio: 4.3 (calc) (ref <5.0)
Triglycerides: 99 mg/dL (ref <150)

## 2023-01-02 IMAGING — CT CT CARDIAC CORONARY ARTERY CALCIUM SCORE
1 of 2 series · 8 of 20 positions shown, 10 images · non-contrast
Comparison: None.

Addendum:
CLINICAL DATA: Cardiovascular Disease Risk stratification

EXAM:
Coronary Calcium Score
TECHNIQUE: A gated, non-contrast computed tomography scan of the heart was
performed using 3mm slice thickness. Axial images were analyzed on a
dedicated workstation. Calcium scoring of the coronary arteries was
performed using the Agatston method.

[Series 3: 2 soft full fov · axial · 0.69mm/px · z∈[+1367,+1445]mm · 8 of 34 slices shown, 10 images]
[im 4/34  vessel]
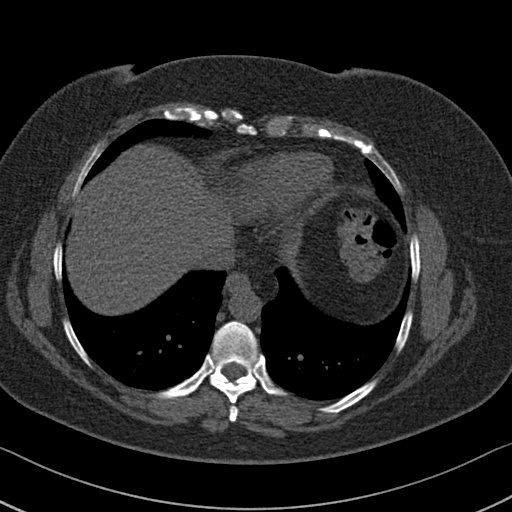
[im 4/34  lung]
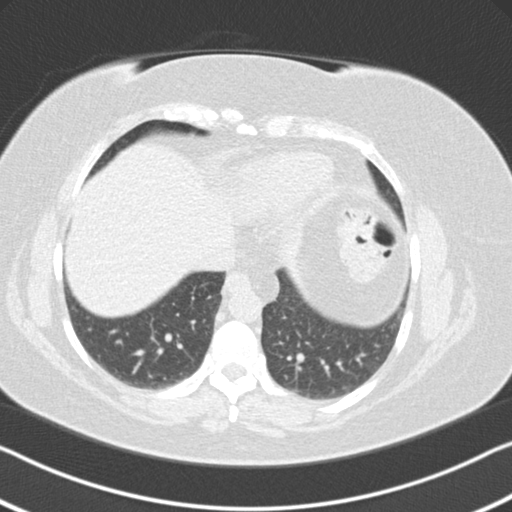
[im 8/34  vessel]
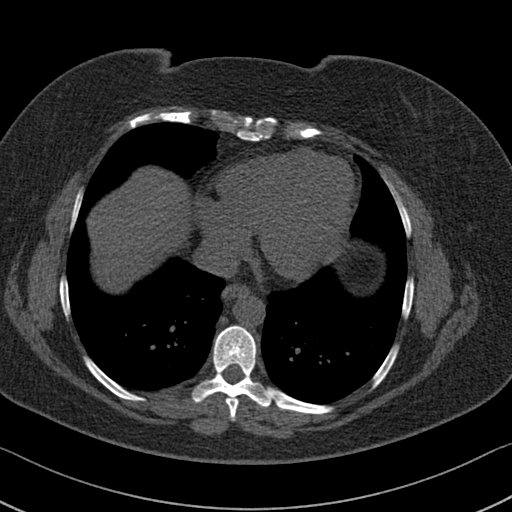
[im 12/34  vessel]
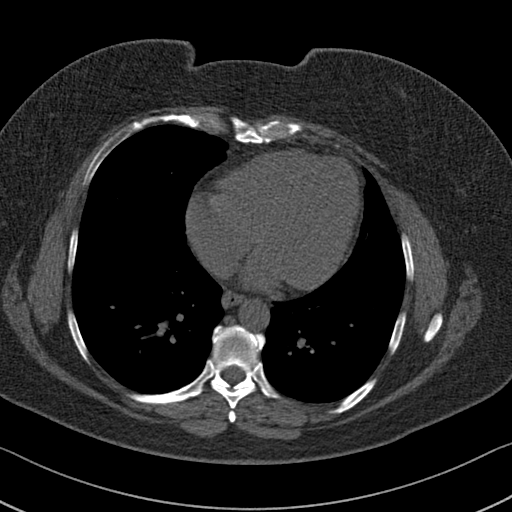
[im 15/34  vessel]
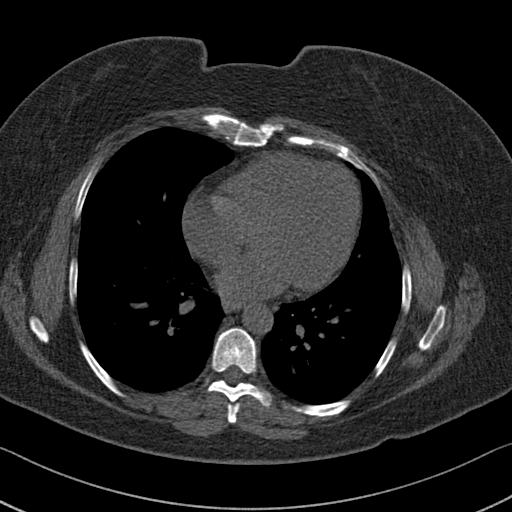
[im 19/34  vessel]
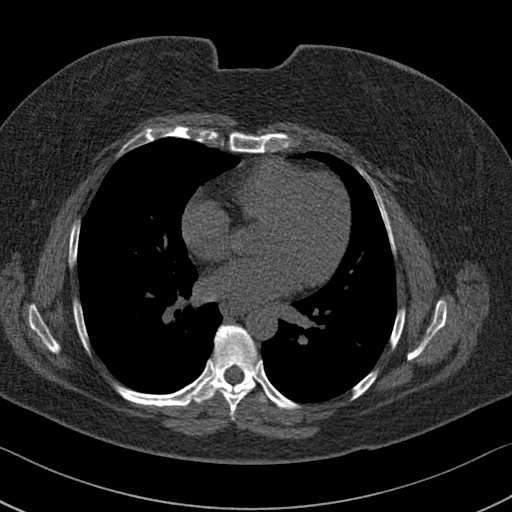
[im 19/34  lung]
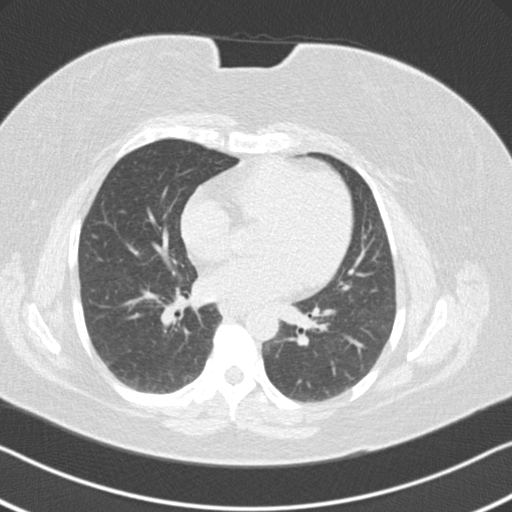
[im 23/34  vessel]
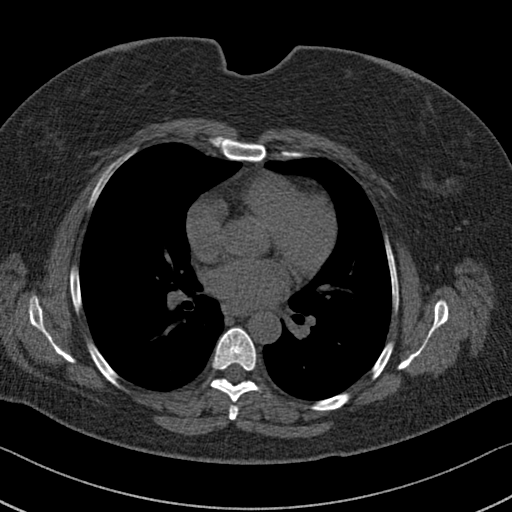
[im 26/34  vessel]
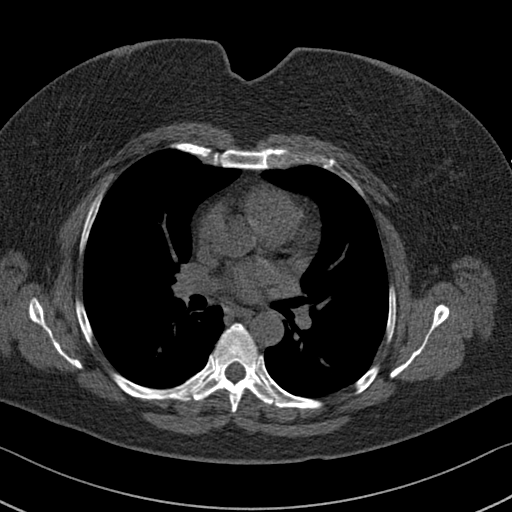
[im 30/34  vessel]
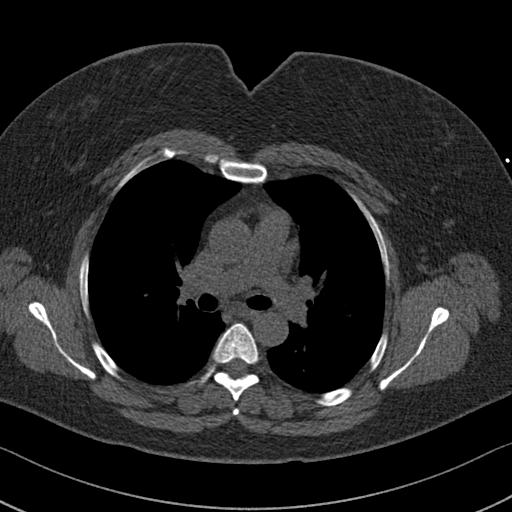

[8 of 20 positions shown; findings below may reference images not displayed]

FINDINGS: Coronary arteries: Normal origins.

Coronary Calcium Score:

Left main:

Left anterior descending artery: 1

Left circumflex artery:

Right coronary artery:

Total: 1

Percentile: 89th

Pericardium: Normal.

Ascending Aorta: Normal caliber.

Non-cardiac: See separate report from [REDACTED].
IMPRESSION: Coronary calcium score of 1. This was 89th percentile for age-,
race-, and sex-matched controls.



If CAC=0, it is reasonable to withhold statin therapy and reassess
in 5 to 10 years, as long as higher risk conditions are absent
(diabetes mellitus, family history of premature CHD in first degree
relatives (males <55 years; females <65 years), cigarette smoking,
or LDL >=190 mg/dL).

If CAC is 1 to 99, it is reasonable to initiate statin therapy for
patients >=55 years of age.

If CAC is >=100 or >=75th percentile, it is reasonable to initiate
statin therapy at any age.

Cardiology referral should be considered for patients with CAC
scores >=400 or >=75th percentile.

*9508 AHA/ACC/AACVPR/AAPA/ABC/ANNUKKA/PAULUS N/LINA/Jeriel/IAIN/GOUW/LAUSCHNER
Guideline on the Management of Blood Cholesterol: A Report of the
American College of Cardiology/American Heart Association Task Force
on Clinical Practice Guidelines. J Am Coll Cardiol.
1765;73(24):6909-6005.

EXAM:
OVER-READ INTERPRETATION  CT CHEST

The following report is an over-read performed by radiologist Dr.
does not include interpretation of cardiac or coronary anatomy or
pathology. The coronary calcium score interpretation by the
cardiologist is attached.
FINDINGS: Visualized mediastinal structures are normal. Images of the upper
abdomen are unremarkable. 2 mm nodule in the right middle lobe on
sequence 5 image 15. Additional punctate nodule in the right middle
lobe on sequence 5, image 11. No significant airspace disease or
consolidation in the visualized lungs. No acute bone abnormality.
IMPRESSION: 1. No acute extracardiac findings.
2. Two small pulmonary nodules in the right lung, largest measures 2
mm. No follow-up needed if patient is low-risk (and has no known or
suspected primary neoplasm). Non-contrast chest CT can be considered
in 12 months if patient is high-risk. This recommendation follows
the consensus statement: Guidelines for Management of Incidental
Pulmonary Nodules Detected on CT Images: From the [HOSPITAL]

*** End of Addendum ***
FINDINGS: Coronary arteries: Normal origins.

Coronary Calcium Score:

Left main:

Left anterior descending artery: 1

Left circumflex artery:

Right coronary artery:

Total: 1

Percentile: 89th

Pericardium: Normal.

Ascending Aorta: Normal caliber.

Non-cardiac: See separate report from [REDACTED].
IMPRESSION: Coronary calcium score of 1. This was 89th percentile for age-,
race-, and sex-matched controls.



If CAC=0, it is reasonable to withhold statin therapy and reassess
in 5 to 10 years, as long as higher risk conditions are absent
(diabetes mellitus, family history of premature CHD in first degree
relatives (males <55 years; females <65 years), cigarette smoking,
or LDL >=190 mg/dL).

If CAC is 1 to 99, it is reasonable to initiate statin therapy for
patients >=55 years of age.

If CAC is >=100 or >=75th percentile, it is reasonable to initiate
statin therapy at any age.

Cardiology referral should be considered for patients with CAC
scores >=400 or >=75th percentile.

*9508 AHA/ACC/AACVPR/AAPA/ABC/ANNUKKA/PAULUS N/LINA/Jeriel/IAIN/GOUW/LAUSCHNER
Guideline on the Management of Blood Cholesterol: A Report of the
American College of Cardiology/American Heart Association Task Force
on Clinical Practice Guidelines. J Am Coll Cardiol.
1765;73(24):6909-6005.

## 2023-03-04 ENCOUNTER — Other Ambulatory Visit: Payer: PRIVATE HEALTH INSURANCE

## 2023-03-04 DIAGNOSIS — E7800 Pure hypercholesterolemia, unspecified: Secondary | ICD-10-CM

## 2023-03-04 DIAGNOSIS — R5383 Other fatigue: Secondary | ICD-10-CM

## 2023-03-04 DIAGNOSIS — Z136 Encounter for screening for cardiovascular disorders: Secondary | ICD-10-CM

## 2023-03-04 NOTE — Progress Notes (Signed)
Patient Care Team: Meghan MackintoshBaxley, Torrance Stockley J, MD as PCP - General (Internal Medicine)  Visit Date: 03/11/23  Subjective:    Patient ID: Meghan Elliott , Female   DOB: [birth year], 47 y.o.    MRN: [id number]   47 y.o. Female presents today for annual health maintenance  exam.   Reports feeling generally well regarding health.  History of allergies treated with Zyrtec 10 mg daily.  History of hyperlipidemia. HDL low at 47, LDL elevated at 125 on 03/04/23, lipid panel otherwise normal. Not interested in taking statins at this time.  Glucose slightly elevated at 102 on 03/04/23. Kidney, liver, thyroid function normal. Blood proteins normal. Electrolytes normal.  Takes multivitamin, glucosamine-chondroitin supplements.  Will be doing Pap, breast exam with gynecologist, Dr. Dareen PianoAnderson.  Mammogram last completed 07/21/22. No mammographic evidence of malignancy in either breast. Recommended repeat in 2024.  Will schedule colonoscopy through Gynecologist.  She is allergic to Penicillin.  History of pure hypercholesterolemia.  LDL is 125.  Has been as high as 168 a year ago.  She is responding to diet and exercise.  Does not want to be on statin medication.  She did have CT coronary calcium scoring in February 2023.  She had 2 small pulmonary nodules in her total coronary calcium score was 1 which is excellent.  History of vitamin D deficiency diagnosed in 2020.  She was treated with high-dose vitamin D for 3 months and was then advised to over-the-counter vitamin D supplement.  Level has not been checked recently due to expense.  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  Completed high school.  She is married.  Quit smoking in 2010.  One daughter.  [place]       Family history: Mother died at age 49 with stroke and diabetes complications.  Father died accidentally with injury.  One sister with insulin-dependent diabetes.  Younger sister with history of ovarian cancer doing okay.      Review of Systems  Constitutional:  Negative for chills, fever, malaise/fatigue and weight loss.  HENT:  Negative for hearing loss, sinus pain and sore throat.   Respiratory:  Negative for cough, hemoptysis and shortness of breath.   Cardiovascular:  Negative for chest pain, palpitations, leg swelling and PND.  Gastrointestinal:  Negative for abdominal pain, constipation, diarrhea, heartburn, nausea and vomiting.  Genitourinary:  Negative for dysuria, frequency and urgency.  Musculoskeletal:  Negative for back pain, myalgias and neck pain.  Skin:  Negative for itching and rash.  Neurological:  Negative for dizziness, tingling, seizures and headaches.  Endo/Heme/Allergies:  Negative for polydipsia.  Psychiatric/Behavioral:  Negative for depression. The patient is not nervous/anxious.         Objective:   Vitals: BP 130/82   Pulse 70   Temp 97.8 F (36.6 C) (Tympanic)   Ht 5\' 4"  (1.626 m)   Wt 214 lb (97.1 kg)   LMP 02/26/2023  SpO2 100%   BMI 36.73 kg/m    Physical Exam Vitals and nursing note reviewed.  Constitutional:      General: She is not in acute distress.    Appearance: Normal appearance. She is not ill-appearing or toxic-appearing.  HENT:     Head: Normocephalic and atraumatic.     Right Ear: Hearing, tympanic membrane, ear canal and external ear normal.     Left Ear: Hearing, tympanic membrane, ear canal and external ear normal.     Mouth/Throat:     Pharynx: Oropharynx is clear.  Eyes:     Extraocular Movements: Extraocular movements  intact.     Pupils: Pupils are equal, round, and reactive to light.  Neck:     Thyroid: No thyroid mass, thyromegaly or thyroid tenderness.     Vascular: No carotid bruit.  Cardiovascular:     Rate and Rhythm: Normal rate and regular rhythm. No extrasystoles are present.    Pulses:          Dorsalis pedis pulses are 1+ on the right side and 1+ on the left side.     Heart sounds: Normal heart sounds. No murmur heard.    No friction rub. No gallop.  Pulmonary:     Effort: Pulmonary effort is normal.     Breath sounds: Normal breath sounds. No decreased breath sounds, wheezing, rhonchi or rales.  Chest:     Chest wall: No mass.  Abdominal:     Palpations: Abdomen is soft. There is no hepatomegaly, splenomegaly or mass.     Tenderness: There is no abdominal tenderness.     Hernia: No hernia is present.  Musculoskeletal:     Cervical back: Normal range of motion.     Right lower leg: No edema.     Left lower leg: No edema.  Lymphadenopathy:     Cervical: No cervical adenopathy.     Upper Body:     Right upper body: No supraclavicular adenopathy.     Left upper body: No supraclavicular adenopathy.  Skin:    General: Skin is warm and dry.  Neurological:     General: No focal deficit present.     Mental Status: She is alert and oriented to person, place, and time. Mental status is at baseline.     Sensory: Sensation is intact.     Motor: Motor function is intact. No weakness.     Deep Tendon Reflexes: Reflexes are normal and symmetric.  Psychiatric:        Attention and Perception: Attention normal.        Mood and Affect: Mood normal.        Speech: Speech normal.        Behavior: Behavior normal.        Thought Content: Thought content normal.        Cognition and Memory: Cognition normal.        Judgment: Judgment normal.       Results:   Studies obtained and personally reviewed by me:  Mammogram last completed 07/21/22. No mammographic evidence of malignancy in either  breast. Recommended repeat in 2024.   Labs:       Component Value Date/Time   NA 139 03/04/2023 0954   K 5.2 03/04/2023 0954   CL 104 03/04/2023 0954   CO2 27 03/04/2023 0954   GLUCOSE 102 (H) 03/04/2023 0954   BUN 22 03/04/2023 0954   CREATININE 0.73 03/04/2023 0954   CALCIUM 8.9 03/04/2023 0954  PROT 7.1 03/04/2023 0954   ALBUMIN 3.8 03/23/2016 1127   AST 17 03/04/2023 0954   ALT 17 03/04/2023 0954   ALKPHOS 51 03/23/2016 1127   BILITOT 0.5 03/04/2023 0954   GFRNONAA 94 11/07/2020 1114   GFRAA 109 11/07/2020 1114     Lab Results  Component Value Date   WBC 7.8 03/04/2023   HGB 12.3 03/04/2023   HCT 37.2 03/04/2023   MCV 91.2 03/04/2023   PLT 395 03/04/2023    Lab Results  Component Value Date   CHOL 190 03/04/2023   HDL 47 (L) 03/04/2023   LDLCALC 125 (H) 03/04/2023   TRIG 85 03/04/2023   CHOLHDL 4.0 03/04/2023    Lab Results  Component Value Date   HGBA1C 5.5 11/07/2020     Lab Results  Component Value Date   TSH 1.46 03/04/2023      Assessment & Plan:   Allergic rhinitis: treated with Zyrtec 10 mg daily.  Hyperlipidemia: HDL low at 47, LDL elevated at 125 on 03/04/23, lipid panel otherwise normal. Not interested in taking statins at this time.  Coronary calcium score was 1  Will be doing Pap, breast exam with gynecologist, Dr. Dareen PianoAnderson.  Today we  placed order for mammogram at [facility] to be done in the near future.  Mammogram: last completed 07/21/22. No mammographic evidence of malignancy in either breast. Ordered mammogram.  Will schedule Colonoscopy through gynecologist.  Vaccine counseling: UTD on flu vaccine. Administered tetanus vaccine today in the office.    I,Meghan Elliott,acting as a Neurosurgeonscribe for Meghan MackintoshMary J Yaneli Keithley, MD.,have documented all relevant documentation on the behalf of Meghan MackintoshMary J Delmy Holdren, MD,as directed by  Meghan MackintoshMary J Paulena Servais, MD while in the presence of Meghan MackintoshMary J Kenyatte Gruber, MD.   I, Meghan MackintoshMary J Anajah Sterbenz, MD, have reviewed all  documentation for this visit. The documentation on 03/11/23 for the exam, diagnosis, procedures, and orders are all accurate and complete.

## 2023-03-05 LAB — CBC WITH DIFFERENTIAL/PLATELET
Absolute Monocytes: 421 cells/uL (ref 200–950)
Basophils Absolute: 47 cells/uL (ref 0–200)
Basophils Relative: 0.6 %
Eosinophils Absolute: 164 cells/uL (ref 15–500)
Eosinophils Relative: 2.1 %
HCT: 37.2 % (ref 35.0–45.0)
Hemoglobin: 12.3 g/dL (ref 11.7–15.5)
Lymphs Abs: 3276 cells/uL (ref 850–3900)
MCH: 30.1 pg (ref 27.0–33.0)
MCHC: 33.1 g/dL (ref 32.0–36.0)
MCV: 91.2 fL (ref 80.0–100.0)
MPV: 9.7 fL (ref 7.5–12.5)
Monocytes Relative: 5.4 %
Neutro Abs: 3892 cells/uL (ref 1500–7800)
Neutrophils Relative %: 49.9 %
Platelets: 395 10*3/uL (ref 140–400)
RBC: 4.08 10*6/uL (ref 3.80–5.10)
RDW: 12.1 % (ref 11.0–15.0)
Total Lymphocyte: 42 %
WBC: 7.8 10*3/uL (ref 3.8–10.8)

## 2023-03-05 LAB — COMPLETE METABOLIC PANEL WITH GFR
AG Ratio: 1.4 (calc) (ref 1.0–2.5)
ALT: 17 U/L (ref 6–29)
AST: 17 U/L (ref 10–35)
Albumin: 4.2 g/dL (ref 3.6–5.1)
Alkaline phosphatase (APISO): 79 U/L (ref 31–125)
BUN: 22 mg/dL (ref 7–25)
CO2: 27 mmol/L (ref 20–32)
Calcium: 8.9 mg/dL (ref 8.6–10.2)
Chloride: 104 mmol/L (ref 98–110)
Creat: 0.73 mg/dL (ref 0.50–0.99)
Globulin: 2.9 g/dL (calc) (ref 1.9–3.7)
Glucose, Bld: 102 mg/dL — ABNORMAL HIGH (ref 65–99)
Potassium: 5.2 mmol/L (ref 3.5–5.3)
Sodium: 139 mmol/L (ref 135–146)
Total Bilirubin: 0.5 mg/dL (ref 0.2–1.2)
Total Protein: 7.1 g/dL (ref 6.1–8.1)
eGFR: 101 mL/min/{1.73_m2} (ref >=60)

## 2023-03-05 LAB — LIPID PANEL
Cholesterol: 190 mg/dL (ref <200)
HDL: 47 mg/dL — ABNORMAL LOW (ref >=50)
LDL Cholesterol (Calc): 125 mg/dL (calc) — ABNORMAL HIGH
Non-HDL Cholesterol (Calc): 143 mg/dL (calc) — ABNORMAL HIGH (ref <130)
Total CHOL/HDL Ratio: 4 (calc) (ref <5.0)
Triglycerides: 85 mg/dL (ref <150)

## 2023-03-05 LAB — TSH: TSH: 1.46 mIU/L

## 2023-03-11 ENCOUNTER — Encounter: Payer: Self-pay | Admitting: Internal Medicine

## 2023-03-11 ENCOUNTER — Ambulatory Visit: Payer: PRIVATE HEALTH INSURANCE | Admitting: Internal Medicine

## 2023-03-11 VITALS — BP 130/82 | HR 70 | Temp 97.8°F | Ht 64.0 in | Wt 214.0 lb

## 2023-03-11 DIAGNOSIS — Z23 Encounter for immunization: Secondary | ICD-10-CM

## 2023-03-11 DIAGNOSIS — Z6836 Body mass index (BMI) 36.0-36.9, adult: Secondary | ICD-10-CM

## 2023-03-11 DIAGNOSIS — Z0000 Encounter for general adult medical examination without abnormal findings: Secondary | ICD-10-CM | POA: Diagnosis not present

## 2023-03-11 DIAGNOSIS — R7302 Impaired glucose tolerance (oral): Secondary | ICD-10-CM

## 2023-03-11 DIAGNOSIS — E7800 Pure hypercholesterolemia, unspecified: Secondary | ICD-10-CM

## 2023-03-11 DIAGNOSIS — R748 Abnormal levels of other serum enzymes: Secondary | ICD-10-CM

## 2023-03-11 LAB — POCT URINALYSIS DIPSTICK
Bilirubin, UA: NEGATIVE
Blood, UA: NEGATIVE
Glucose, UA: NEGATIVE
Leukocytes, UA: NEGATIVE
Nitrite, UA: NEGATIVE
Protein, UA: NEGATIVE
Spec Grav, UA: 1.015 (ref 1.010–1.025)
Urobilinogen, UA: 0.2 E.U./dL
pH, UA: 5 (ref 5.0–8.0)

## 2023-03-11 NOTE — Patient Instructions (Addendum)
It was a pleasure to see you today. Tdap updated and is good for 10 years. Labs reviewed. Please have colonoscopy and annual mammogram. Return in one year or as needed. Will be seeing GYN physician soon

## 2023-07-23 LAB — HM PAP SMEAR

## 2023-07-23 LAB — HM MAMMOGRAPHY

## 2023-07-29 ENCOUNTER — Encounter: Payer: Self-pay | Admitting: Internal Medicine

## 2024-03-09 ENCOUNTER — Other Ambulatory Visit: Payer: Self-pay

## 2024-03-09 DIAGNOSIS — Z0000 Encounter for general adult medical examination without abnormal findings: Secondary | ICD-10-CM

## 2024-03-09 DIAGNOSIS — E7800 Pure hypercholesterolemia, unspecified: Secondary | ICD-10-CM

## 2024-03-09 LAB — COMPLETE METABOLIC PANEL WITHOUT GFR
AG Ratio: 1.5 (calc) (ref 1.0–2.5)
ALT: 17 U/L (ref 6–29)
AST: 18 U/L (ref 10–35)
Albumin: 4.3 g/dL (ref 3.6–5.1)
Alkaline phosphatase (APISO): 86 U/L (ref 31–125)
BUN: 19 mg/dL (ref 7–25)
CO2: 28 mmol/L (ref 20–32)
Calcium: 9.4 mg/dL (ref 8.6–10.2)
Chloride: 101 mmol/L (ref 98–110)
Creat: 0.84 mg/dL (ref 0.50–0.99)
Globulin: 2.9 g/dL (ref 1.9–3.7)
Glucose, Bld: 110 mg/dL — ABNORMAL HIGH (ref 65–99)
Potassium: 5.2 mmol/L (ref 3.5–5.3)
Sodium: 137 mmol/L (ref 135–146)
Total Bilirubin: 0.5 mg/dL (ref 0.2–1.2)
Total Protein: 7.2 g/dL (ref 6.1–8.1)

## 2024-03-09 LAB — CBC WITH DIFFERENTIAL/PLATELET
Absolute Lymphocytes: 3551 {cells}/uL (ref 850–3900)
Absolute Monocytes: 394 {cells}/uL (ref 200–950)
Basophils Absolute: 49 {cells}/uL (ref 0–200)
Basophils Relative: 0.6 %
Eosinophils Absolute: 123 {cells}/uL (ref 15–500)
Eosinophils Relative: 1.5 %
HCT: 39.1 % (ref 35.0–45.0)
Hemoglobin: 12.9 g/dL (ref 11.7–15.5)
MCH: 30.3 pg (ref 27.0–33.0)
MCHC: 33 g/dL (ref 32.0–36.0)
MCV: 91.8 fL (ref 80.0–100.0)
MPV: 9.5 fL (ref 7.5–12.5)
Monocytes Relative: 4.8 %
Neutro Abs: 4084 {cells}/uL (ref 1500–7800)
Neutrophils Relative %: 49.8 %
Platelets: 372 10*3/uL (ref 140–400)
RBC: 4.26 10*6/uL (ref 3.80–5.10)
RDW: 12.4 % (ref 11.0–15.0)
Total Lymphocyte: 43.3 %
WBC: 8.2 10*3/uL (ref 3.8–10.8)

## 2024-03-09 LAB — TSH: TSH: 1.45 m[IU]/L

## 2024-03-09 LAB — LIPID PANEL
Cholesterol: 219 mg/dL — ABNORMAL HIGH (ref <200)
HDL: 48 mg/dL — ABNORMAL LOW (ref >=50)
LDL Cholesterol (Calc): 152 mg/dL — ABNORMAL HIGH
Non-HDL Cholesterol (Calc): 171 mg/dL — ABNORMAL HIGH (ref <130)
Total CHOL/HDL Ratio: 4.6 (calc) (ref <5.0)
Triglycerides: 85 mg/dL (ref <150)

## 2024-03-09 NOTE — Progress Notes (Signed)
 Lab only

## 2024-03-13 ENCOUNTER — Encounter: Payer: Self-pay | Admitting: Internal Medicine

## 2024-03-13 NOTE — Progress Notes (Signed)
 Annual Wellness Visit   Patient Care Team: Sylvan Evener, MD as PCP - General (Internal Medicine)  Visit Date: 03/14/24   Chief Complaint  Patient presents with  . Annual Exam   Subjective:  Patient: Meghan Elliott, Female DOB: 10/10/1976, 48 y.o. MRN: [id number] Meghan Elliott is a 48 y.o. Female who presents today for her Annual Wellness Visit. Patient has Impaired Glucose Tolerance; Low Serum HDL; and Pure Hypercholesterolemia.  Says that she believes that she is becoming perimenopausal as she is experiencing hot flashes and irregular menstrual cycles recently. She also mentions that she has been having occasional joint pain, especially in her elbows since she does weight-lifting.  History of Hyperlipidemia with 03/09/2024 Lipid Panel, compared to 03/2023: Cholesterol 129, elevated from 190; HDL 48, slightly elevated from 47; LDL 152, elevated from 125. Coronary Calcium Score of 1 on 02/13/2022, with note of 2 small pulmonary nodules with the largest being 2 mm. She says that she would like to continue working on diet and exercise to manage this.   History of Impaired Glucose Tolerance with A1c 5.7 in 2019 & 5.8 in 2017.  Last HgbA1c 2021 was 5.5.   History of Allergies treated with Zyrtec 10 mg daily  History of Vitamin-D Deficiency, diagnosed in 2020 with level at 27. Level has not been checked recently due to expense.  Takes Glucosamine-Chondroitin 500-400 mg and Multivitamin  Labs 03/09/2024 CBC: WNL CMP, compared to 03/2023: Blood Glucose 110, elevated from 102; otherwise WNL.   TSH: 1.45  PAP Smear 07/23/2023 through [facility]. Mammogram 07/23/2023  normal with repeat recommendation of 2025. She says that she will get her pap and mammogram at her gyn clinic this summer.  Colonoscopy due. Discussed - she she will call her local GI to schedule as she will be going on a vacation upcoming and we will place referral if needed.   Bone Density will be due 2040.   Vaccine  Counseling: Due for Covid-19; UTD on Flu and Tdap. Past Medical History:  Diagnosis Date  . Infection of left hand due to bite    cat bite, drainage   Medical/Surgical History Narrative:  Allergic/Intolerant to: Penicillins; Aspirin - nausea/vomiting  2015 - Seen by Dr. Sandrea Cruel regarding Diarrhea; after course of Cipro  and Clindamycin  for Cat Bite prescribed in the ED, so was suspected she had Abx Associated Diarrhea or partially treated C. Difficile. Diarrhea resolved following a course of Flagyl .  2014 - On December 9th she was bitten by her cat, treated with Cipro  and Clindamycin , which is the incident that preceded her diarrhea.  2010 - Quit Smoking  Other - Hx of: 1 Pregnancy Family History  Problem Relation Age of Onset  . Diabetes Mother   . Hypertension Mother   . Hypertension Father   . Heart attack Neg Hx   . Hyperlipidemia Neg Hx   . Sudden death Neg Hx   . Ovarian cancer Sister   . Diabetes Sister    Family History Narrative: No Family History of Heart attack, Hyperlipidemia, or Sudden death. Social History   Social History Narrative   Works as an Print production planner for [employer]. Completed High School. Married. Former smoker - quit in 2010. 1 daughter and 1 grandchild. Resides in [place].   2025 - Powerlifting competition in May. Exercising by weight-lifitng 4-5x/week and walking 5x/week.       Fhx:   Mother, deceased aged 25, w/ hx of Stroke, Diabetes, and Hypertension   Father, deceased  due to an injury obtained during an accident, w/ hx of Hypertension   1 Sister w/ hx of Insulin-Dependent Diabetes; Younger Sister w/ hx of Ovarian Cancer   Review of Systems  Constitutional:  Negative for chills, fever, malaise/fatigue and weight loss.       (+) Hot Flashes   HENT:  Negative for hearing loss, sinus pain and sore throat.   Respiratory:  Negative for cough, hemoptysis and shortness of breath.   Cardiovascular:  Negative for chest pain,  palpitations, leg swelling and PND.  Gastrointestinal:  Negative for abdominal pain, constipation, diarrhea, heartburn, nausea and vomiting.  Genitourinary:  Negative for dysuria, frequency and urgency.       (+) Irregular Menstrual Cycles  Musculoskeletal:  Negative for back pain, myalgias and neck pain. Joint pain: occasional, she does lift weights. Skin:  Negative for itching and rash.  Neurological:  Negative for dizziness, tingling, seizures and headaches.  Endo/Heme/Allergies:  Negative for polydipsia.  Psychiatric/Behavioral:  Negative for depression. The patient is not nervous/anxious.     Objective:  Vitals: BP 138/86   Pulse 79   Temp 98 F (36.7 C) (Temporal)   Ht 5' 4.75" (1.645 m)   Wt 222 lb 12.8 oz (101.1 kg)   SpO2 98%   BMI 37.36 kg/m  Physical Exam Vitals and nursing note reviewed.  Constitutional:      General: She is not in acute distress.    Appearance: Normal appearance. She is not ill-appearing or toxic-appearing.  HENT:     Head: Normocephalic and atraumatic.     Right Ear: Hearing, tympanic membrane, ear canal and external ear normal.     Left Ear: Hearing, tympanic membrane, ear canal and external ear normal.     Mouth/Throat:     Pharynx: Oropharynx is clear.  Eyes:     Extraocular Movements: Extraocular movements intact.     Pupils: Pupils are equal, round, and reactive to light.  Neck:     Thyroid: No thyroid mass, thyromegaly or thyroid tenderness.     Vascular: No carotid bruit.  Cardiovascular:     Rate and Rhythm: Normal rate and regular rhythm. No extrasystoles are present.    Pulses:          Dorsalis pedis pulses are 2+ on the right side and 2+ on the left side.     Heart sounds: Normal heart sounds. No murmur heard.    No friction rub. No gallop.  Pulmonary:     Effort: Pulmonary effort is normal.     Breath sounds: Normal breath sounds. No decreased breath sounds, wheezing, rhonchi or rales.  Chest:     Chest wall: No mass.      Comments: Breast exam deferred to GYN Abdominal:     Palpations: Abdomen is soft. There is no hepatomegaly, splenomegaly or mass.     Tenderness: There is no abdominal tenderness.     Hernia: No hernia is present.  Musculoskeletal:     Cervical back: Normal range of motion.     Right lower leg: No edema.     Left lower leg: No edema.  Lymphadenopathy:     Cervical: No cervical adenopathy.     Upper Body:     Right upper body: No supraclavicular adenopathy.     Left upper body: No supraclavicular adenopathy.  Skin:    General: Skin is warm and dry.  Neurological:     General: No focal deficit present.     Mental Status: She  is alert and oriented to person, place, and time. Mental status is at baseline.     Sensory: Sensation is intact.     Motor: Motor function is intact. No weakness.     Deep Tendon Reflexes: Reflexes are normal and symmetric.  Psychiatric:        Attention and Perception: Attention normal.        Mood and Affect: Mood normal.        Speech: Speech normal.        Behavior: Behavior normal.        Thought Content: Thought content normal.        Cognition and Memory: Cognition normal.        Judgment: Judgment normal.  Most Recent Fall Risk Assessment:    03/14/2024    2:56 PM  Fall Risk   Falls in the past year? 0  Number falls in past yr: 0  Injury with Fall? 0  Risk for fall due to : No Fall Risks  Follow up Falls evaluation completed   Most Recent Depression Screenings:    03/14/2024    2:57 PM 03/11/2023    9:55 AM  PHQ 2/9 Scores  PHQ - 2 Score 0 0   Results:  Studies Obtained And Personally Reviewed By Me:  Mammogram 07/23/2023  normal with repeat recommendation of 2025.  Labs:     Component Value Date/Time   NA 137 03/09/2024 0919   K 5.2 03/09/2024 0919   CL 101 03/09/2024 0919   CO2 28 03/09/2024 0919   GLUCOSE 110 (H) 03/09/2024 0919   BUN 19 03/09/2024 0919   CREATININE 0.84 03/09/2024 0919   CALCIUM 9.4 03/09/2024 0919   PROT  7.2 03/09/2024 0919   ALBUMIN 3.8 03/23/2016 1127   AST 18 03/09/2024 0919   ALT 17 03/09/2024 0919   ALKPHOS 51 03/23/2016 1127   BILITOT 0.5 03/09/2024 0919   GFRNONAA 94 11/07/2020 1114   GFRAA 109 11/07/2020 1114    Lab Results  Component Value Date   WBC 8.2 03/09/2024   HGB 12.9 03/09/2024   HCT 39.1 03/09/2024   MCV 91.8 03/09/2024   PLT 372 03/09/2024   Lab Results  Component Value Date   CHOL 219 (H) 03/09/2024   HDL 48 (L) 03/09/2024   LDLCALC 152 (H) 03/09/2024   TRIG 85 03/09/2024   CHOLHDL 4.6 03/09/2024   Lab Results  Component Value Date   HGBA1C 5.5 11/07/2020    Lab Results  Component Value Date   TSH 1.45 03/09/2024    Assessment & Plan:   Orders Placed This Encounter  Procedures  . POCT URINALYSIS DIP (CLINITEK)   Meds ordered this encounter  Medications  . meloxicam  (MOBIC ) 15 MG tablet    Sig: Take 1 tablet (15 mg total) by mouth daily.    Dispense:  30 tablet    Refill:  0  Other Labs Reviewed today: CBC: WNL CMP, compared to 03/2023: Blood Glucose 110, elevated from 102; otherwise WNL.   TSH: 1.45  becoming Perimenopausal as she is experiencing hot flashes and irregular menstrual cycles recently.  occasional Joint Pain, especially in her elbows since she does weight-lifting. Sending  Hyperlipidemia with 03/09/2024 Lipid Panel, compared to 03/2023: Cholesterol 129, elevated from 190; HDL 48, slightly elevated from 47; LDL 152, elevated from 125. Coronary Calcium Score of 1 on 02/13/2022, with note of 2 small pulmonary nodules with the largest being 2 mm. She says that she would like to continue  working on diet and exercise to manage this.   Impaired Glucose Tolerance with A1c 5.7 in 2019 & 5.8 in 2017. Last HgbA1c 2021 was 5.5.   Allergies treated with Zyrtec 10 mg daily  Vitamin-D Deficiency, diagnosed in 2020 with level at 27. Level has not been checked recently due to expense.  PAP Smear 07/23/2023 through [facility]. Mammogram  07/23/2023  normal with repeat recommendation of 2025. She says that she will get her pap and mammogram at her gyn clinic this summer.  Colonoscopy due. Discussed - she she will call her local GI to schedule as she will be going on a vacation upcoming and we will place referral if needed.   Bone Density will be due 2040.   Vaccine Counseling: Due for Covid-19; UTD on Flu and Tdap.   Annual wellness visit done today including the all of the following: Reviewed patient's Family Medical History Reviewed and updated list of patient's medical providers Assessment of cognitive impairment was done Assessed patient's functional ability Established a written schedule for health screening services Health Risk Assessent Completed and Reviewed  Discussed health benefits of physical activity, and encouraged her to engage in regular exercise appropriate for her age and condition.    I,Emily Lagle,acting as a Neurosurgeon for Sylvan Evener, MD.,have documented all relevant documentation on the behalf of Sylvan Evener, MD,as directed by  Sylvan Evener, MD while in the presence of Sylvan Evener, MD.   I, Sylvan Evener, MD, have reviewed all documentation for this visit. The documentation on 03/27/24 for the exam, diagnosis, procedures, and orders are all accurate and complete.

## 2024-03-14 ENCOUNTER — Ambulatory Visit: Payer: PRIVATE HEALTH INSURANCE | Admitting: Internal Medicine

## 2024-03-14 ENCOUNTER — Encounter: Payer: Self-pay | Admitting: Internal Medicine

## 2024-03-14 VITALS — BP 138/86 | HR 79 | Temp 98.0°F | Ht 64.75 in | Wt 222.8 lb

## 2024-03-14 DIAGNOSIS — R300 Dysuria: Secondary | ICD-10-CM | POA: Diagnosis not present

## 2024-03-14 DIAGNOSIS — Z0000 Encounter for general adult medical examination without abnormal findings: Secondary | ICD-10-CM | POA: Diagnosis not present

## 2024-03-14 DIAGNOSIS — R7302 Impaired glucose tolerance (oral): Secondary | ICD-10-CM

## 2024-03-14 DIAGNOSIS — E786 Lipoprotein deficiency: Secondary | ICD-10-CM

## 2024-03-14 DIAGNOSIS — M7918 Myalgia, other site: Secondary | ICD-10-CM

## 2024-03-14 DIAGNOSIS — E7800 Pure hypercholesterolemia, unspecified: Secondary | ICD-10-CM

## 2024-03-14 LAB — POCT URINALYSIS DIP (CLINITEK)
Bilirubin, UA: NEGATIVE
Blood, UA: NEGATIVE
Glucose, UA: NEGATIVE mg/dL
Ketones, POC UA: NEGATIVE mg/dL
Leukocytes, UA: NEGATIVE
Nitrite, UA: NEGATIVE
POC PROTEIN,UA: NEGATIVE
Spec Grav, UA: 1.01 (ref 1.010–1.025)
Urobilinogen, UA: 0.2 U/dL
pH, UA: 7 (ref 5.0–8.0)

## 2024-03-14 MED ORDER — MELOXICAM 15 MG PO TABS: 15.0000 mg | ORAL_TABLET | Freq: Every day | ORAL | 0 refills | Status: DC

## 2024-03-28 NOTE — Patient Instructions (Addendum)
 It was a pleasure to see you today. You may continue meloxicam  for musculoskeletal pain. Please continue with diet and exercise efforts. Return in  one year or as needed. Lipid lowering medication offered as well as metformin for glucose intolerance but patient declines.

## 2024-04-23 ENCOUNTER — Encounter: Payer: Self-pay | Admitting: Internal Medicine

## 2024-04-25 ENCOUNTER — Other Ambulatory Visit: Payer: Self-pay

## 2024-04-25 MED ORDER — MELOXICAM 15 MG PO TABS: 15.0000 mg | ORAL_TABLET | Freq: Every day | ORAL | 0 refills | Status: DC

## 2024-07-02 ENCOUNTER — Encounter: Payer: Self-pay | Admitting: Internal Medicine

## 2024-07-03 ENCOUNTER — Encounter: Payer: Self-pay | Admitting: Internal Medicine

## 2024-07-03 ENCOUNTER — Telehealth: Admitting: Internal Medicine

## 2024-07-03 VITALS — Temp 99.7°F

## 2024-07-03 DIAGNOSIS — U071 COVID-19: Secondary | ICD-10-CM | POA: Diagnosis not present

## 2024-07-03 MED ORDER — HYDROCODONE BIT-HOMATROP MBR 5-1.5 MG/5ML PO SOLN: 5.0000 mL | Freq: Three times a day (TID) | ORAL | 0 refills | Status: AC | PRN

## 2024-07-03 MED ORDER — AZITHROMYCIN 250 MG PO TABS: ORAL_TABLET | ORAL | 0 refills | Status: AC

## 2024-07-03 NOTE — Progress Notes (Signed)
 Patient Care Team: Meghan Meghan PARAS, MD as PCP - General (Internal Medicine)  I connected with Meghan Elliott on 07/03/24 at 3:02 PM by video enabled telemedicine visit and verified that I am speaking with the correct person using two identifiers, myself and Meghan Elliott, CMA. I am in my office and patient is in their home.    I discussed the limitations, risks, security and privacy concerns of performing an evaluation and management service by telemedicine and the availability of in-person appointments. I also discussed with the patient that there may be a patient responsible charge related to this service. The patient expressed understanding and agreed to proceed.   Other persons participating in the visit and their role in the encounter: Medical scribe, Meghan Elliott  Patient's location: Home  Provider's location: Clinic   Chief Complaint  Patient presents with   Covid Positive   Subjective:  Patient PI:Meghan Elliott,Female DOB:02-Feb-1976,48 y.o. FMW:[id number]   48 y.o. Female presents today for acute sick visit with Covid-19 Positive. Patient has a past medical history of Impaired Glucose Tolerance; Pure Hypercholesterolemia; Low HDL. UTD on Covid-19 vaccine a couple weeks ago per patient report. To preface, says that she had traveled to [place], [place] for 1 week to see her grandchildren, and returned Sunday via plane travel. Her symptoms reportedly began Sunday with sore throat, fever around 101F, mild headache, and nasal/sinus congestion, but denies SOB; has been managing fever with OTC Tylenol . She says that she's done 3 at-home Covid-19 tests, all of which were positive, and her grandson has also tested positive.  Past Medical History:  Diagnosis Date   Infection of left hand due to bite    cat bite, drainage    Allergies  Allergen Reactions   Aspirin Nausea And Vomiting   Penicillins    Family History  Problem Relation Age of Onset   Diabetes Mother    Hypertension Mother     Hypertension Father    Heart attack Neg Hx    Hyperlipidemia Neg Hx    Sudden death Neg Hx    Ovarian cancer Sister    Diabetes Sister    Social History   Social History Narrative   Works as an Print production planner for [employer]. Completed High School. Married. Former smoker - quit in 2010. 1 daughter and 1 grandchild. Resides in [place].   2025 - Powerlifting competition in May. Exercising by weight-lifitng 4-5x/week and walking 5x/week.       Fhx:   Mother, deceased aged 55, w/ hx of Stroke, Diabetes, and Hypertension   Father, deceased due to an injury obtained during an accident, w/ hx of Hypertension   1 Sister w/ hx of Insulin-Dependent Diabetes; Younger Sister w/ hx of Ovarian Cancer   Review of Systems  Constitutional:  Positive for fever (up to 101) and malaise/fatigue.  HENT:  Positive for congestion (nasal/sinus) and sore throat.   Respiratory:  Positive for cough. Negative for shortness of breath.   Neurological:  Positive for headaches.     Objective:  Vitals: Temp 99.7 F (37.6 C)  Physical Exam Vitals and nursing note reviewed.  Constitutional:      General: She is not in acute distress.    Appearance: Normal appearance. She is not toxic-appearing.     Comments: Sounds congested  HENT:     Head: Normocephalic and atraumatic.  Pulmonary:     Effort: Pulmonary effort is normal.  Skin:    General: Skin is warm and dry.  Neurological:  Mental Status: She is alert and oriented to person, place, and time. Mental status is at baseline.  Psychiatric:        Mood and Affect: Mood normal.        Behavior: Behavior normal.        Thought Content: Thought content normal.        Judgment: Judgment normal.     Results:  Studies Obtained And Personally Reviewed By Me: Labs:     Component Value Date/Time   NA 137 03/09/2024 0919   K 5.2 03/09/2024 0919   CL 101 03/09/2024 0919   CO2 28 03/09/2024 0919   GLUCOSE 110 (H) 03/09/2024 0919   BUN 19  03/09/2024 0919   CREATININE 0.84 03/09/2024 0919   CALCIUM 9.4 03/09/2024 0919   PROT 7.2 03/09/2024 0919   ALBUMIN 3.8 03/23/2016 1127   AST 18 03/09/2024 0919   ALT 17 03/09/2024 0919   ALKPHOS 51 03/23/2016 1127   BILITOT 0.5 03/09/2024 0919   GFRNONAA 94 11/07/2020 1114   GFRAA 109 11/07/2020 1114    Lab Results  Component Value Date   WBC 8.2 03/09/2024   HGB 12.9 03/09/2024   HCT 39.1 03/09/2024   MCV 91.8 03/09/2024   PLT 372 03/09/2024   Lab Results  Component Value Date   CHOL 219 (H) 03/09/2024   HDL 48 (L) 03/09/2024   LDLCALC 152 (H) 03/09/2024   TRIG 85 03/09/2024   CHOLHDL 4.6 03/09/2024   Lab Results  Component Value Date   HGBA1C 5.5 11/07/2020    Lab Results  Component Value Date   TSH 1.45 03/09/2024    Assessment & Plan:  I provided 20 minutes of time spent during this encounter, including chart review, interviewing patient, medical decision making, and e-scribing medication with > 50% was spent counseling as documented under my assessment & plan. Meds ordered this encounter  Medications   HYDROcodone  bit-homatropine (HYCODAN) 5-1.5 MG/5ML syrup    Sig: Take 5 mLs by mouth every 8 (eight) hours as needed for cough.    Dispense:  120 mL    Refill:  0   azithromycin  (ZITHROMAX ) 250 MG tablet    Sig: Take 2 tablets on day 1, then 1 tablet daily on days 2 through 5    Dispense:  6 tablet    Refill:  0  COVID-19: UTD on Covid-19 vaccine a couple weeks ago per patient report. Has been traveling out of state via air travel to visit her grandchildren and returned Saturday with symptoms onset Sunday. Managing fever with OTC Tylenol . Covid-19 confirmed with 3 at-home tests and grandson also tested positive.   Sending in Azithromycin  250 mg - take 2 tablets on Day 1 and 1 tablet on Days 2-5 and Hycodan syrup for cough - take 1 teaspoon every 8 hours as needed. Quarantine 5 days from symptom onset (Sunday to Thursday). Stay well rested, well hydrated, and well  nourished. Walk around some to prevent atelectasis. Contact us  in 48 hours if symptoms worsen/persist despite treatment.    I,Meghan Elliott,acting as a Neurosurgeon for Meghan JINNY Hailstone, MD.,have documented all relevant documentation on the behalf of Meghan JINNY Hailstone, MD,as directed by  Meghan JINNY Hailstone, MD while in the presence of Meghan JINNY Hailstone, MD.    I, Meghan JINNY Hailstone, MD, have reviewed all documentation for this visit. The documentation on 07/03/2024 for the exam, diagnosis, procedures, and orders are all accurate and complete.

## 2024-07-04 ENCOUNTER — Encounter: Payer: Self-pay | Admitting: Internal Medicine

## 2024-07-04 NOTE — Patient Instructions (Addendum)
 We are sorry you are not feeling well today. We have discussed treatment options for Covid-19. I have prescribed Zithromax  Z pak 2 tabs day 1 followed by one tab days 2-5. I have discussed staying at home for 5 days.Please rest and stay well hydrated. Walk some to prevent atelectasis of the lungs. Call if symptoms fail to improve in 48 hours or sooner if worse.

## 2024-08-25 DIAGNOSIS — Z1231 Encounter for screening mammogram for malignant neoplasm of breast: Secondary | ICD-10-CM | POA: Diagnosis not present

## 2024-08-25 DIAGNOSIS — Z01419 Encounter for gynecological examination (general) (routine) without abnormal findings: Secondary | ICD-10-CM | POA: Diagnosis not present

## 2024-08-25 LAB — HM MAMMOGRAPHY

## 2024-08-29 ENCOUNTER — Encounter: Payer: Self-pay | Admitting: Internal Medicine

## 2024-10-24 DIAGNOSIS — Z1211 Encounter for screening for malignant neoplasm of colon: Secondary | ICD-10-CM | POA: Diagnosis not present

## 2024-10-31 DIAGNOSIS — M9902 Segmental and somatic dysfunction of thoracic region: Secondary | ICD-10-CM | POA: Diagnosis not present

## 2024-10-31 DIAGNOSIS — M9903 Segmental and somatic dysfunction of lumbar region: Secondary | ICD-10-CM | POA: Diagnosis not present

## 2024-10-31 DIAGNOSIS — M9901 Segmental and somatic dysfunction of cervical region: Secondary | ICD-10-CM | POA: Diagnosis not present

## 2024-10-31 DIAGNOSIS — M9905 Segmental and somatic dysfunction of pelvic region: Secondary | ICD-10-CM | POA: Diagnosis not present

## 2024-12-15 ENCOUNTER — Encounter: Payer: Self-pay | Admitting: Internal Medicine

## 2024-12-18 ENCOUNTER — Other Ambulatory Visit: Payer: Self-pay

## 2024-12-18 MED ORDER — MELOXICAM 15 MG PO TABS: 15.0000 mg | ORAL_TABLET | Freq: Every day | ORAL | 1 refills | Status: AC

## 2024-12-18 NOTE — Telephone Encounter (Signed)
 Patient is requesting a refill on Meloxicam  last refill 04/25/2024 #90 + 0 refills Last OV 03/14/24

## 2024-12-18 NOTE — Telephone Encounter (Signed)
 Sent to Dr. Perri for approval.

## 2025-03-12 ENCOUNTER — Other Ambulatory Visit: Payer: Self-pay

## 2025-03-15 ENCOUNTER — Encounter: Payer: Self-pay | Admitting: Internal Medicine
